# Patient Record
Sex: Female | Born: 1970 | Race: Black or African American | Hispanic: No | Marital: Single | Smoking: Never smoker
Health system: Southern US, Community
[De-identification: ages and names within clinical notes are randomized; demographics above are authoritative.]

## PROBLEM LIST (undated history)

## (undated) ENCOUNTER — Ambulatory Visit (HOSPITAL_COMMUNITY): Admission: EM | Source: Home / Self Care

## (undated) DIAGNOSIS — Z5189 Encounter for other specified aftercare: Secondary | ICD-10-CM

## (undated) DIAGNOSIS — K5909 Other constipation: Secondary | ICD-10-CM

## (undated) DIAGNOSIS — D649 Anemia, unspecified: Secondary | ICD-10-CM

## (undated) DIAGNOSIS — E0590 Thyrotoxicosis, unspecified without thyrotoxic crisis or storm: Secondary | ICD-10-CM

## (undated) DIAGNOSIS — T7840XA Allergy, unspecified, initial encounter: Secondary | ICD-10-CM

## (undated) HISTORY — DX: Allergy, unspecified, initial encounter: T78.40XA

## (undated) HISTORY — PX: CHOLECYSTECTOMY: SHX55

## (undated) HISTORY — DX: Encounter for other specified aftercare: Z51.89

---

## 2008-12-18 HISTORY — PX: CHOLECYSTECTOMY: SHX55

## 2013-11-01 ENCOUNTER — Inpatient Hospital Stay (HOSPITAL_COMMUNITY): Payer: 59

## 2013-11-01 ENCOUNTER — Encounter (HOSPITAL_COMMUNITY): Payer: Self-pay

## 2013-11-01 ENCOUNTER — Observation Stay (HOSPITAL_COMMUNITY)
Admission: AD | Admit: 2013-11-01 | Discharge: 2013-11-02 | Disposition: A | Discharge status: Home / Self Care | Payer: 59 | Source: Ambulatory Visit | Attending: Obstetrics and Gynecology | Admitting: Obstetrics and Gynecology

## 2013-11-01 DIAGNOSIS — R5381 Other malaise: Secondary | ICD-10-CM | POA: Insufficient documentation

## 2013-11-01 DIAGNOSIS — R42 Dizziness and giddiness: Secondary | ICD-10-CM | POA: Insufficient documentation

## 2013-11-01 DIAGNOSIS — R5383 Other fatigue: Secondary | ICD-10-CM

## 2013-11-01 DIAGNOSIS — D649 Anemia, unspecified: Secondary | ICD-10-CM

## 2013-11-01 DIAGNOSIS — D500 Iron deficiency anemia secondary to blood loss (chronic): Principal | ICD-10-CM | POA: Insufficient documentation

## 2013-11-01 DIAGNOSIS — N920 Excessive and frequent menstruation with regular cycle: Secondary | ICD-10-CM | POA: Insufficient documentation

## 2013-11-01 HISTORY — DX: Other constipation: K59.09

## 2013-11-01 HISTORY — DX: Thyrotoxicosis, unspecified without thyrotoxic crisis or storm: E05.90

## 2013-11-01 HISTORY — DX: Anemia, unspecified: D64.9

## 2013-11-01 LAB — CBC
Hemoglobin: 8.7 g/dL — ABNORMAL LOW (ref 12.0–15.0)
MCH: 31.2 pg (ref 26.0–34.0)
MCHC: 36 g/dL (ref 30.0–36.0)
Platelets: 248 10*3/uL (ref 150–400)
RDW: 11.7 % (ref 11.5–15.5)
WBC: 8.8 10*3/uL (ref 4.0–10.5)

## 2013-11-01 LAB — ABO/RH: ABO/RH(D): B NEG

## 2013-11-01 LAB — POCT PREGNANCY, URINE: Preg Test, Ur: NEGATIVE

## 2013-11-01 LAB — HEMOGLOBIN AND HEMATOCRIT, BLOOD
HCT: 21.5 % — ABNORMAL LOW (ref 36.0–46.0)
Hemoglobin: 7.8 g/dL — ABNORMAL LOW (ref 12.0–15.0)

## 2013-11-01 LAB — WET PREP, GENITAL: Yeast Wet Prep HPF POC: NONE SEEN

## 2013-11-01 MED ORDER — SODIUM CHLORIDE 0.9 % IJ SOLN: 3.0000 mL | Freq: Two times a day (BID) | INTRAMUSCULAR | Status: DC

## 2013-11-01 MED ORDER — PRENATAL MULTIVITAMIN CH
1.0000 | ORAL_TABLET | Freq: Every day | ORAL | Status: DC
  Filled 2013-11-01 (×2): qty 1

## 2013-11-01 MED ORDER — OXYCODONE-ACETAMINOPHEN 5-325 MG PO TABS: 1.0000 | ORAL_TABLET | ORAL | Status: DC | PRN

## 2013-11-01 MED ORDER — IBUPROFEN 600 MG PO TABS: 600.0000 mg | ORAL_TABLET | Freq: Four times a day (QID) | ORAL | Status: DC | PRN

## 2013-11-01 MED ORDER — TRANEXAMIC ACID 650 MG PO TABS
1300.0000 mg | ORAL_TABLET | Freq: Three times a day (TID) | ORAL | Status: DC
  Administered 2013-11-01 (×2): 1300 mg via ORAL
  Filled 2013-11-01 (×3): qty 2

## 2013-11-01 MED ORDER — SODIUM CHLORIDE 0.9 % IV SOLN: 250.0000 mL | INTRAVENOUS | Status: DC | PRN

## 2013-11-01 MED ORDER — SODIUM CHLORIDE 0.9 % IJ SOLN: 3.0000 mL | INTRAMUSCULAR | Status: DC | PRN

## 2013-11-01 MED ORDER — LACTATED RINGERS IV SOLN
INTRAVENOUS | Status: DC
  Administered 2013-11-02: 04:00:00 via INTRAVENOUS

## 2013-11-01 MED ORDER — ACETAMINOPHEN 325 MG PO TABS
650.0000 mg | ORAL_TABLET | Freq: Once | ORAL | Status: AC
  Administered 2013-11-01: 650 mg via ORAL
  Filled 2013-11-01: qty 2

## 2013-11-01 MED ORDER — ONDANSETRON 8 MG PO TBDP
8.0000 mg | ORAL_TABLET | Freq: Once | ORAL | Status: AC
  Administered 2013-11-01: 8 mg via ORAL
  Filled 2013-11-01: qty 1

## 2013-11-01 MED ORDER — DIPHENHYDRAMINE HCL 50 MG/ML IJ SOLN
25.0000 mg | Freq: Once | INTRAMUSCULAR | Status: AC
  Administered 2013-11-01: 25 mg via INTRAVENOUS
  Filled 2013-11-01: qty 1

## 2013-11-01 MED ORDER — LACTATED RINGERS IV BOLUS (SEPSIS)
1000.0000 mL | Freq: Once | INTRAVENOUS | Status: AC
  Administered 2013-11-01: 1000 mL via INTRAVENOUS

## 2013-11-01 NOTE — MAU Note (Signed)
Pt states bleeding began Thursday am, however bleeding became very heavy last pm, early this am. Notes medium sized clots. Mild pain intermittently.

## 2013-11-01 NOTE — MAU Provider Note (Signed)
History     CSN: [id number]  Arrival date and time: 11/01/13 0272   First Provider Initiated Contact with Patient 11/01/13 0848      Chief Complaint  Patient presents with  . Vaginal Bleeding   HPI  42 y.o. [id number] with heavy vaginal bleeding x 2 days. Soaking about 1 pad/hour and passing fist sized clots. Her OB/GYN from home called in Lysteda for her, she took 2 tabs yesterday night and 2 this morning. She is also taking Motrin 800 mg TID. States she had a Nuva Ring in when the bleeding started, removed once bleeding started. She has been continuously cycling on Nuva Ring x 8 months with no break. Has been having spotting over the last few weeks. She does not usually have heavy periods or irregular bleeding. + mild cramping. + dizziness.   Past Medical History  Diagnosis Date  . Anemia   . Hyperthyroidism   . Constipation, chronic     Past Surgical History  Procedure Laterality Date  . Cesarean section      Breech  . Cholecystectomy      History reviewed. No pertinent family history.  History  Substance Use Topics  . Smoking status: Never Smoker   . Smokeless tobacco: Never Used  . Alcohol Use: No    Allergies:  Allergies  Allergen Reactions  . Sulfa Antibiotics Anaphylaxis    Prescriptions prior to admission  Medication Sig Dispense Refill  . etonogestrel-ethinyl estradiol (NUVARING) 0.12-0.015 MG/24HR vaginal ring Place 1 each vaginally every 28 (twenty-eight) days. Insert vaginally and leave in place for 3 consecutive weeks, then remove for 1 week.      . methimazole (TAPAZOLE) 5 MG tablet Take 5 mg by mouth 3 (three) times daily. While nuvaring is in place      . tranexamic acid (LYSTEDA) 650 MG TABS tablet Take 1,300 mg by mouth 3 (three) times daily. Up to 5 days for heavy bleeding      . Vitamin D, Ergocalciferol, (DRISDOL) 50000 UNITS CAPS capsule Take 50,000 Units by mouth every 7 (seven) days. fridays      . [DISCONTINUED] ibuprofen (ADVIL,MOTRIN) 800  MG tablet Take 800 mg by mouth every 8 (eight) hours as needed for moderate pain.        Review of Systems  Respiratory: Negative.   Cardiovascular: Negative.   Gastrointestinal: Positive for abdominal pain (cramping). Negative for nausea, vomiting, diarrhea and constipation.  Genitourinary: Negative for dysuria, urgency, frequency, hematuria and flank pain.       + vaginal bleeding  Musculoskeletal: Negative.   Neurological: Positive for dizziness and weakness.  Psychiatric/Behavioral: Negative.    Physical Exam   Blood pressure 105/59, pulse 114, temperature 98.5 F (36.9 C), temperature source Oral, resp. rate 18, height 5\' 1"  (1.549 m), weight 127 lb (57.607 kg), last menstrual period 10/30/2013, SpO2 100.00%.  Patient Vitals for the past 24 hrs:  BP Temp Temp src Pulse Resp SpO2 Height Weight  11/01/13 1243 105/59 mmHg - - 114 - - - -  11/01/13 1229 82/44 mmHg - - 93 - - - -  11/01/13 1226 81/44 mmHg - - 88 18 - - -  11/01/13 1150 75/43 mmHg 98.5 F (36.9 C) Oral 96 18 - - -  11/01/13 0847 111/73 mmHg 98.3 F (36.8 C) Oral 111 22 100 % 5\' 1"  (1.549 m) 127 lb (57.607 kg)    Physical Exam  Nursing note and vitals reviewed. Constitutional: She is oriented to person, place, and time.  She appears well-developed and well-nourished. No distress.  Cardiovascular: Normal rate and regular rhythm.   Respiratory: Effort normal.  GI: Soft. There is no tenderness.  Genitourinary: There is no rash, tenderness, lesion or injury on the right labia. There is no rash, tenderness, lesion or injury on the left labia. Uterus is enlarged. Uterus is not deviated, not fixed and not tender. Cervix exhibits no motion tenderness, no discharge and no friability. Right adnexum displays no mass, no tenderness and no fullness. Left adnexum displays no mass, no tenderness and no fullness. There is bleeding (moderate-heavy) around the vagina.  Musculoskeletal: Normal range of motion.  Neurological: She is  alert and oriented to person, place, and time.  Skin: Skin is warm and dry.  Psychiatric: She has a normal mood and affect.    MAU Course  Procedures Results for orders placed during the hospital encounter of 11/01/13 (from the past 24 hour(s))  POCT PREGNANCY, URINE     Status: None   Collection Time    11/01/13  8:43 AM      Result Value Range   Preg Test, Ur NEGATIVE  NEGATIVE  CBC     Status: Abnormal   Collection Time    11/01/13  8:49 AM      Result Value Range   WBC 8.8  4.0 - 10.5 K/uL   RBC 2.79 (*) 3.87 - 5.11 MIL/uL   Hemoglobin 8.7 (*) 12.0 - 15.0 g/dL   HCT 69.6 (*) 29.5 - 28.4 %   MCV 86.7  78.0 - 100.0 fL   MCH 31.2  26.0 - 34.0 pg   MCHC 36.0  30.0 - 36.0 g/dL   RDW 13.2  44.0 - 10.2 %   Platelets 248  150 - 400 K/uL  HEMOGLOBIN AND HEMATOCRIT, BLOOD     Status: Abnormal   Collection Time    11/01/13  8:49 AM      Result Value Range   Hemoglobin 7.8 (*) 12.0 - 15.0 g/dL   HCT 72.5 (*) 36.6 - 44.0 %  WET PREP, GENITAL     Status: Abnormal   Collection Time    11/01/13  9:11 AM      Result Value Range   Yeast Wet Prep HPF POC NONE SEEN  NONE SEEN   Trich, Wet Prep NONE SEEN  NONE SEEN   Clue Cells Wet Prep HPF POC NONE SEEN  NONE SEEN   WBC, Wet Prep HPF POC RARE (*) NONE SEEN   US Transvaginal Non-ob  11/01/2013   CLINICAL DATA:  Heavy vaginal bleeding, history of fibroids, urine pregnancy test negative  EXAM: TRANSABDOMINAL AND TRANSVAGINAL ULTRASOUND OF PELVIS  TECHNIQUE: Both transabdominal and transvaginal ultrasound examinations of the pelvis were performed. Transabdominal technique was performed for global imaging of the pelvis including uterus, ovaries, adnexal regions, and pelvic cul-de-sac. It was necessary to proceed with endovaginal exam following the transabdominal exam to visualize the ovaries and better detail.  COMPARISON:  None  FINDINGS: Uterus  Measurements: 10.2 x 5.0 x 6.1 cm. Mildly heterogeneous echotexture but no large fibroid by  ultrasound.  Endometrium  Thickness: 3 mm. Heterogeneous fluid within the lower uterine segment and cervix, suspect blood.  Right ovary  Measurements: 2.5 x 1.0 x 1.5 cm. Normal appearance/no adnexal mass.  Left ovary  Measurements: 1.8 x 1.2 x 1.3 cm. Normal appearance. No adnexal mass.  Other findings  No free fluid.  IMPRESSION: Heterogeneous fluid within the lower uterine segment and endocervical canal, suspect blood.  Mildly heterogeneous uterus but no large fibroid by ultrasound  Normal ovaries  No free fluid   Electronically Signed   By: Ruel Favors M.D.   On: 11/01/2013 11:41   US Pelvis Complete  11/01/2013   CLINICAL DATA:  Heavy vaginal bleeding, history of fibroids, urine pregnancy test negative  EXAM: TRANSABDOMINAL AND TRANSVAGINAL ULTRASOUND OF PELVIS  TECHNIQUE: Both transabdominal and transvaginal ultrasound examinations of the pelvis were performed. Transabdominal technique was performed for global imaging of the pelvis including uterus, ovaries, adnexal regions, and pelvic cul-de-sac. It was necessary to proceed with endovaginal exam following the transabdominal exam to visualize the ovaries and better detail.  COMPARISON:  None  FINDINGS: Uterus  Measurements: 10.2 x 5.0 x 6.1 cm. Mildly heterogeneous echotexture but no large fibroid by ultrasound.  Endometrium  Thickness: 3 mm. Heterogeneous fluid within the lower uterine segment and cervix, suspect blood.  Right ovary  Measurements: 2.5 x 1.0 x 1.5 cm. Normal appearance/no adnexal mass.  Left ovary  Measurements: 1.8 x 1.2 x 1.3 cm. Normal appearance. No adnexal mass.  Other findings  No free fluid.  IMPRESSION: Heterogeneous fluid within the lower uterine segment and endocervical canal, suspect blood.  Mildly heterogeneous uterus but no large fibroid by ultrasound  Normal ovaries  No free fluid   Electronically Signed   By: Ruel Favors M.D.   On: 11/01/2013 11:41   After return from u/s, bleeding continues to be heavy, pt remains  lightheaded, hypotensive now. IV fluid bolus initiated and repeat H&H ordered. Hct decreased from 24.2 to 21.5 in less than 4 hours, Dr. Jolayne Panther consulted.   Assessment and Plan  DUB and Anemia Admit for blood transfusion, orders per Dr. Jeneen Montgomery 11/01/2013, 1:16 PM

## 2013-11-01 NOTE — MAU Note (Signed)
Pt very pale upon arrival. Helped to restroom and back to room from bathroom via wheelchair. Urinalysis cup full of dark red blood.

## 2013-11-02 DIAGNOSIS — N920 Excessive and frequent menstruation with regular cycle: Secondary | ICD-10-CM

## 2013-11-02 DIAGNOSIS — D649 Anemia, unspecified: Secondary | ICD-10-CM

## 2013-11-02 LAB — TYPE AND SCREEN
ABO/RH(D): B NEG
Unit division: 0
Weak D: POSITIVE

## 2013-11-02 LAB — CBC
HCT: 25.5 % — ABNORMAL LOW (ref 36.0–46.0)
Hemoglobin: 9.1 g/dL — ABNORMAL LOW (ref 12.0–15.0)
MCH: 30.5 pg (ref 26.0–34.0)
Platelets: 123 10*3/uL — ABNORMAL LOW (ref 150–400)
RBC: 2.98 MIL/uL — ABNORMAL LOW (ref 3.87–5.11)
WBC: 9.8 10*3/uL (ref 4.0–10.5)

## 2013-11-02 MED ORDER — FERROUS SULFATE 325 (65 FE) MG PO TABS: 325.0000 mg | ORAL_TABLET | Freq: Two times a day (BID) | ORAL | Status: DC

## 2013-11-02 MED ORDER — DOCUSATE SODIUM 100 MG PO CAPS: 100.0000 mg | ORAL_CAPSULE | Freq: Two times a day (BID) | ORAL | Status: DC | PRN

## 2013-11-02 NOTE — Plan of Care (Signed)
Problem: Phase I Progression Outcomes Goal: Hemodynamically stable Outcome: Not Progressing Blood pressures remain low with a pulse that is WNL  Problem: Phase II Progression Outcomes Goal: Discharge plan established Outcome: Not Progressing VSS Bleeding WNL Hgb stable Patient able to ambulate and not get dizzy  Problem: Phase III Progression Outcomes Goal: Pain controlled on oral analgesia Outcome: Not Applicable Date Met:  11/02/13 Patein has denied any pain Goal: Activity at appropriate level-compared to baseline (UP IN CHAIR FOR HEMODIALYSIS)  Outcome: Completed/Met Date Met:  11/02/13 Less dizzy when standing

## 2013-11-02 NOTE — Discharge Summary (Signed)
Physician Discharge Summary  Patient ID: Debra Armstrong MRN: [id number] DOB/AGE: [birth year] 42 y.o.  Admit date: 11/01/2013 Discharge date: 11/02/2013  Admission Diagnoses: menorrhagia with symptomatic anemia  Discharge Diagnoses: same s/p blood transfusion Active Problems:   * No active hospital problems. *   Discharged Condition: good  Hospital Course: 42 yo G3P1021 admitted secondary to symptomatic anemia secondary to menorrhagia. Patient presented to MAU secondary to generalized weakness and dizziness. Patient had been using Nuvaring continuously for 8 months and experienced heavy vaginal bleeding this past week. She was started on lysteda but only took 2 doses before the symptoms of anemia began. Patient was continued on lysteda during her stay and was transfused 3 units. Her symptoms resolved and patient was discharged home. Discharge precautions were provided  Consults: None  Significant Diagnostic Studies: labs: hg 8.7-->7.8--> 9.1 and radiology: Ultrasound: normal with 5 mm endometrial lining  Treatments: 3 units pRBC  Discharge Exam: Blood pressure 88/50, pulse 79, temperature 98.3 F (36.8 C), temperature source Oral, resp. rate 16, height 5\' 1"  (1.549 m), weight 127 lb (57.607 kg), last menstrual period 10/30/2013, SpO2 100.00%. General appearance: alert, cooperative and no distress Resp: clear to auscultation bilaterally Cardio: regular rate and rhythm GI: soft, non-tender; bowel sounds normal; no masses,  no organomegaly Extremities: extremities normal, atraumatic, no cyanosis or edema and no edema, redness or tenderness in the calves or thighs  Disposition: Final discharge disposition not confirmed     Medication List    STOP taking these medications       ibuprofen 800 MG tablet  Commonly known as:  ADVIL,MOTRIN      TAKE these medications       docusate sodium 100 MG capsule  Commonly known as:  COLACE  Take 1 capsule (100 mg total) by mouth 2 (two)  times daily as needed.     etonogestrel-ethinyl estradiol 0.12-0.015 MG/24HR vaginal ring  Commonly known as:  NUVARING  Place 1 each vaginally every 28 (twenty-eight) days. Insert vaginally and leave in place for 3 consecutive weeks, then remove for 1 week.     ferrous sulfate 325 (65 FE) MG tablet  Commonly known as:  FERROUSUL  Take 1 tablet (325 mg total) by mouth 2 (two) times daily.     methimazole 5 MG tablet  Commonly known as:  TAPAZOLE  Take 5 mg by mouth 3 (three) times daily. While nuvaring is in place     tranexamic acid 650 MG Tabs tablet  Commonly known as:  LYSTEDA  Take 1,300 mg by mouth 3 (three) times daily. Up to 5 days for heavy bleeding     Vitamin D (Ergocalciferol) 50000 UNITS Caps capsule  Commonly known as:  DRISDOL  Take 50,000 Units by mouth every 7 (seven) days. fridays         Signed: Merl Bommarito 11/02/2013, 6:43 AM

## 2013-11-02 NOTE — Progress Notes (Signed)
Discharge instructions provided to patient at bedside.  Activity, medications, when to call the doctor and community resources discussed.  No questions at this time.  Home medications returned from pharmacy, 22 tablets of tranexamic acid.  Patient left unit in stable condition with all personal belongings accompanied by staff.  Osvaldo Angst, RN----------

## 2013-11-03 LAB — GC/CHLAMYDIA PROBE AMP: CT Probe RNA: NEGATIVE

## 2013-11-04 NOTE — MAU Provider Note (Signed)
Attestation of Attending Supervision of Advanced Practitioner (CNM/NP): Evaluation and management procedures were performed by the Advanced Practitioner under my supervision and collaboration.  I have reviewed the Advanced Practitioner's note and chart, and I agree with the management and plan.  Marche Hottenstein 11/04/2013 9:31 AM

## 2014-06-15 ENCOUNTER — Ambulatory Visit (HOSPITAL_COMMUNITY)
Admission: AD | Admit: 2014-06-15 | Discharge: 2014-06-15 | Disposition: A | Discharge status: Home / Self Care | Payer: 59 | Source: Ambulatory Visit | Attending: Obstetrics and Gynecology | Admitting: Obstetrics and Gynecology

## 2014-06-15 ENCOUNTER — Other Ambulatory Visit: Payer: Self-pay | Admitting: Obstetrics and Gynecology

## 2014-06-15 DIAGNOSIS — R079 Chest pain, unspecified: Secondary | ICD-10-CM | POA: Insufficient documentation

## 2014-08-13 IMAGING — US US TRANSVAGINAL NON-OB
1 series · 14 of 25 positions shown · non-contrast
Comparison: None

CLINICAL DATA: Heavy vaginal bleeding, history of fibroids, urine
pregnancy test negative

EXAM:
TRANSABDOMINAL AND TRANSVAGINAL ULTRASOUND OF PELVIS
TECHNIQUE: Both transabdominal and transvaginal ultrasound examinations of the
pelvis were performed. Transabdominal technique was performed for
global imaging of the pelvis including uterus, ovaries, adnexal
regions, and pelvic cul-de-sac. It was necessary to proceed with
endovaginal exam following the transabdominal exam to visualize the
ovaries and better detail.

[Series 1: us pelvis complete · 14 of 44 slices shown]
[im 1/44]
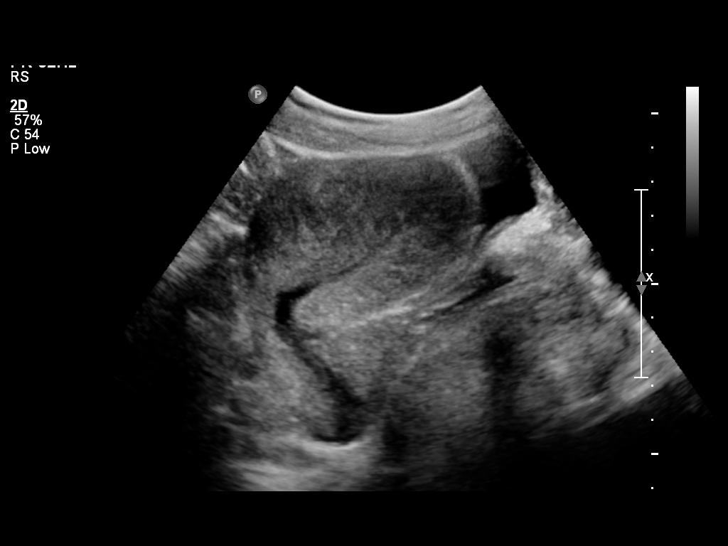
[im 4/44]
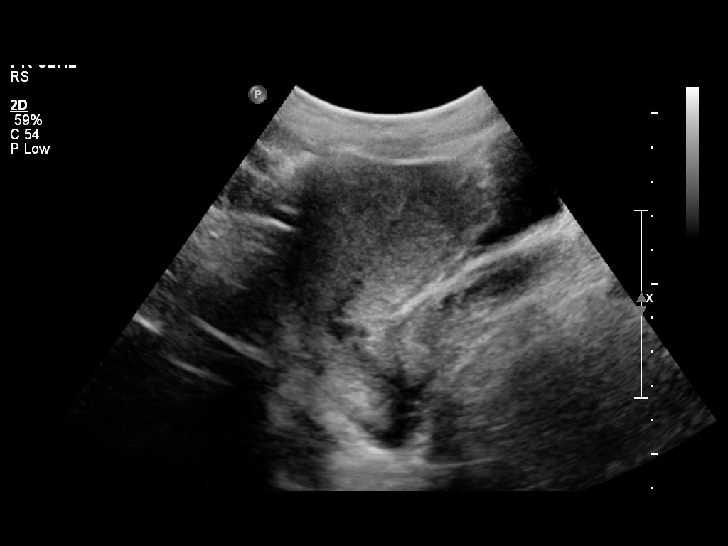
[im 8/44]
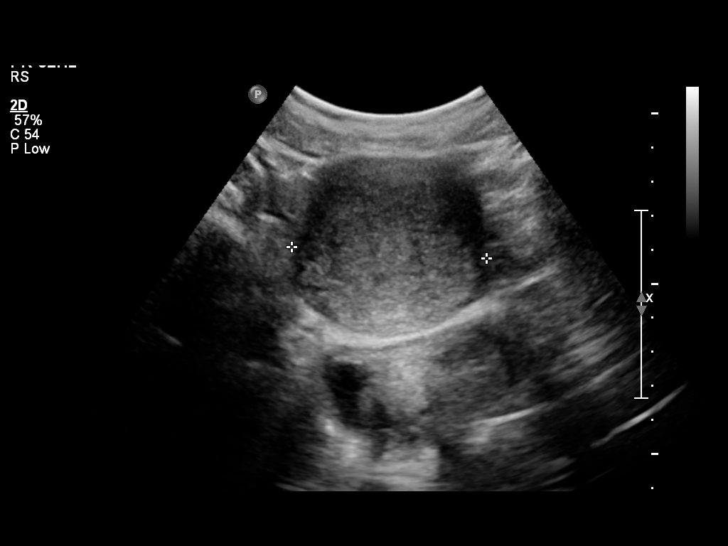
[im 11/44]
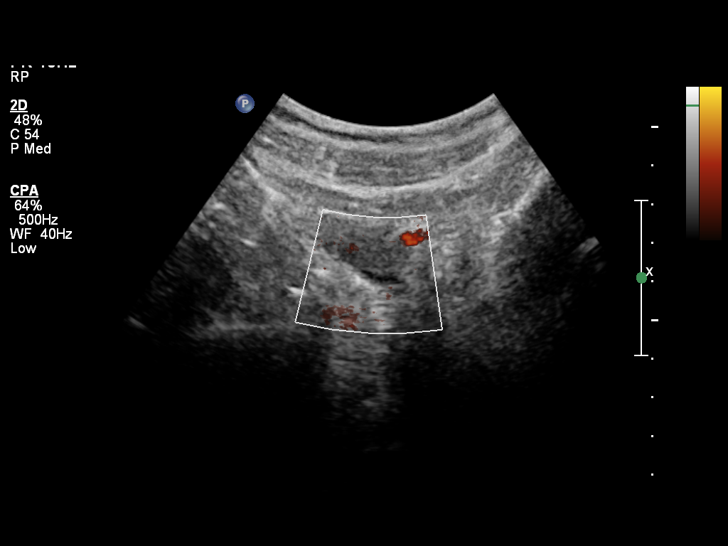
[im 15/44]
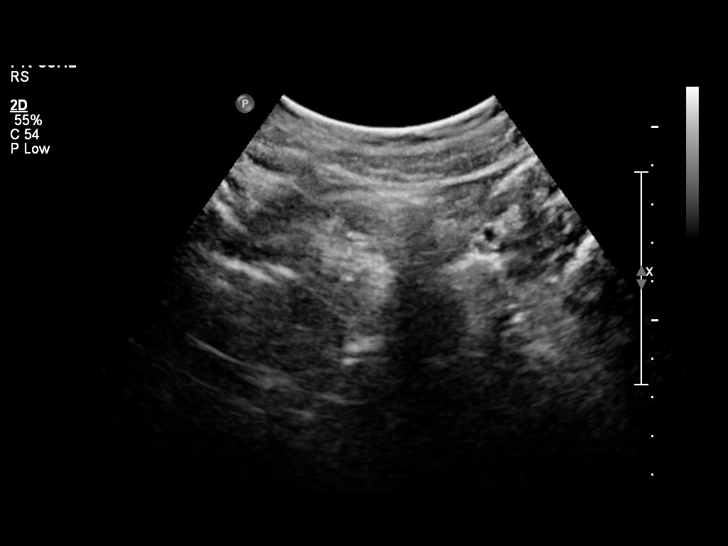
[im 17/44]
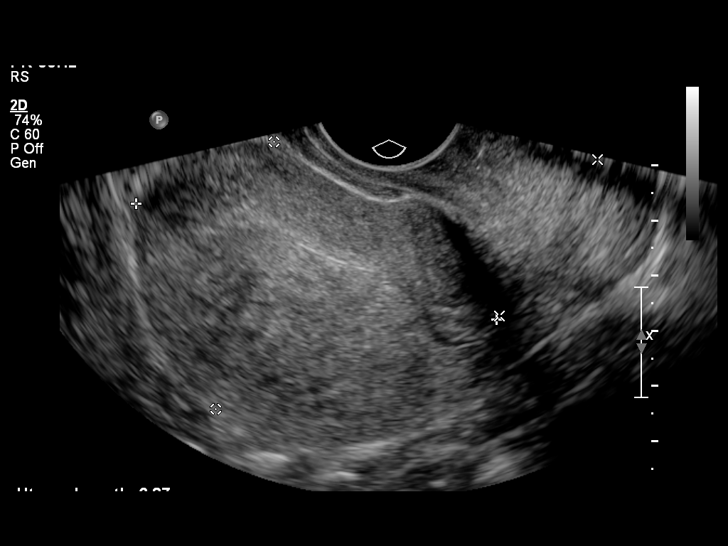
[im 20/44]
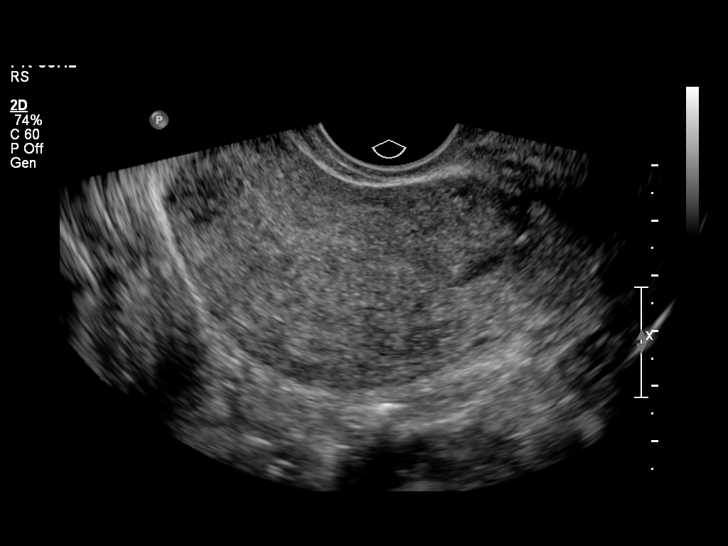
[im 24/44]
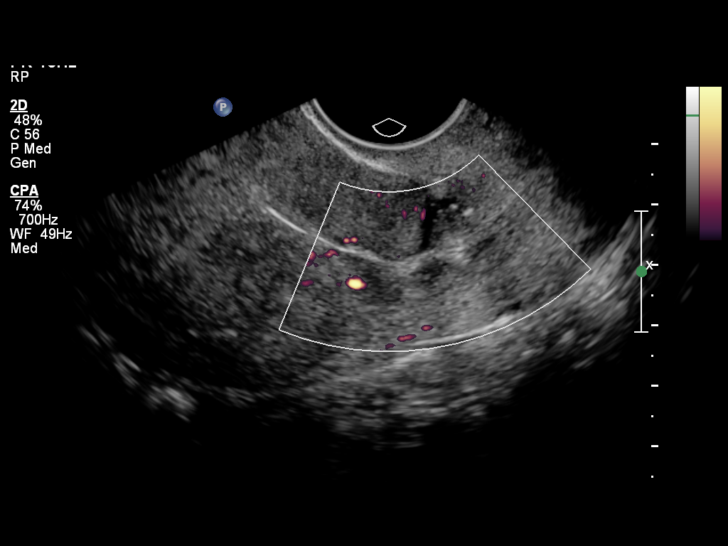
[im 27/44]
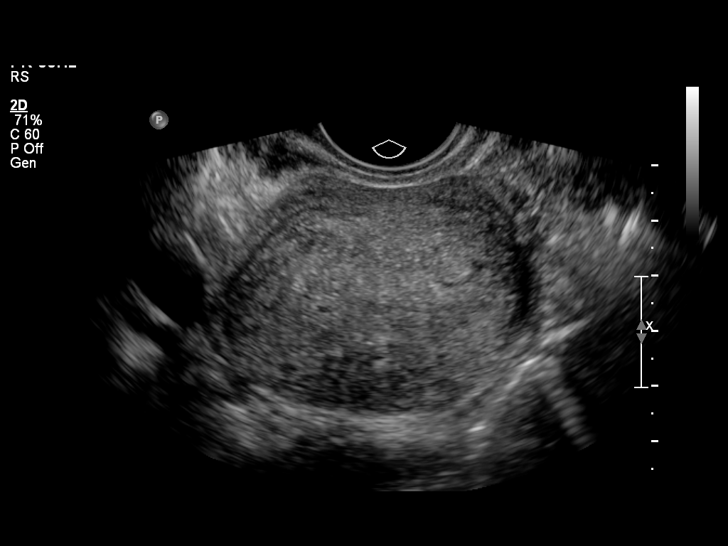
[im 29/44]
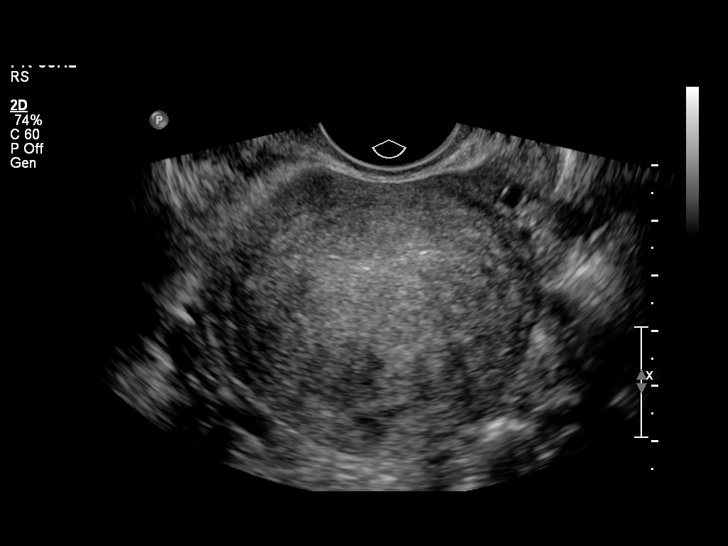
[im 33/44]
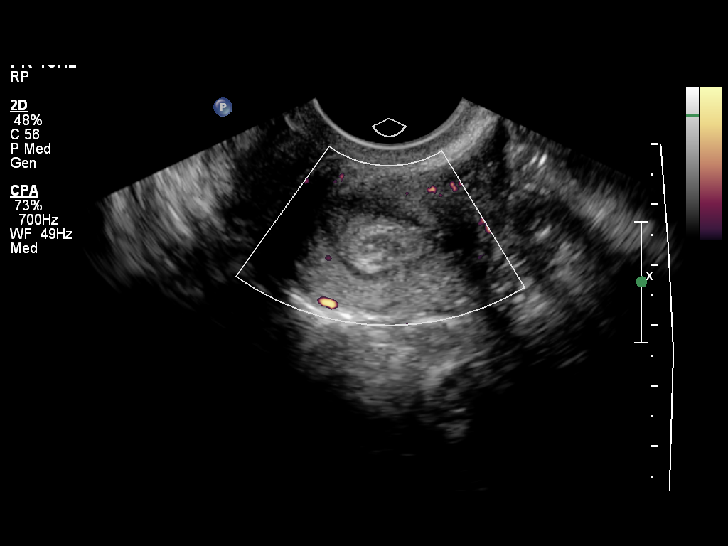
[im 36/44]
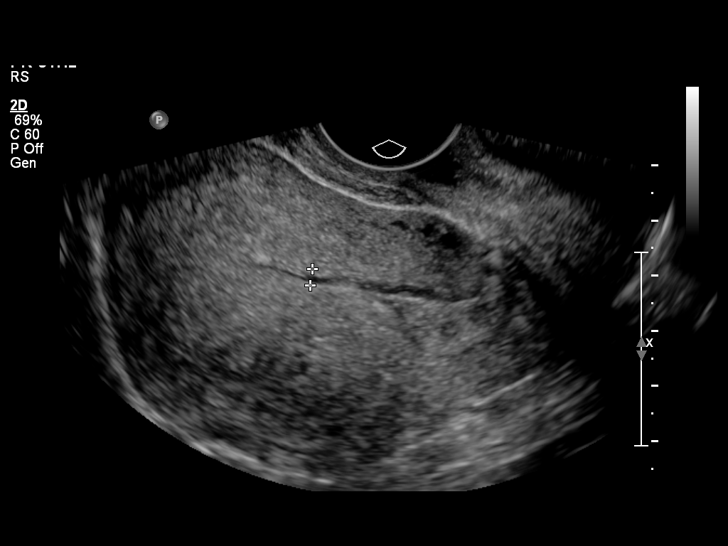
[im 40/44]
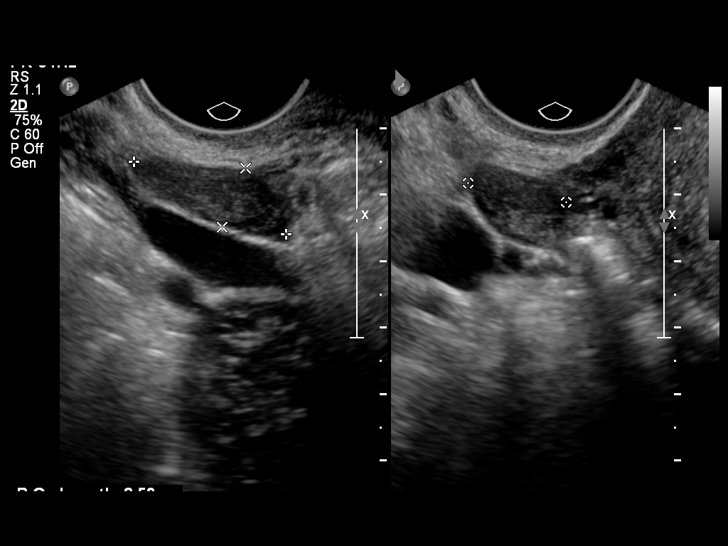
[im 44/44]
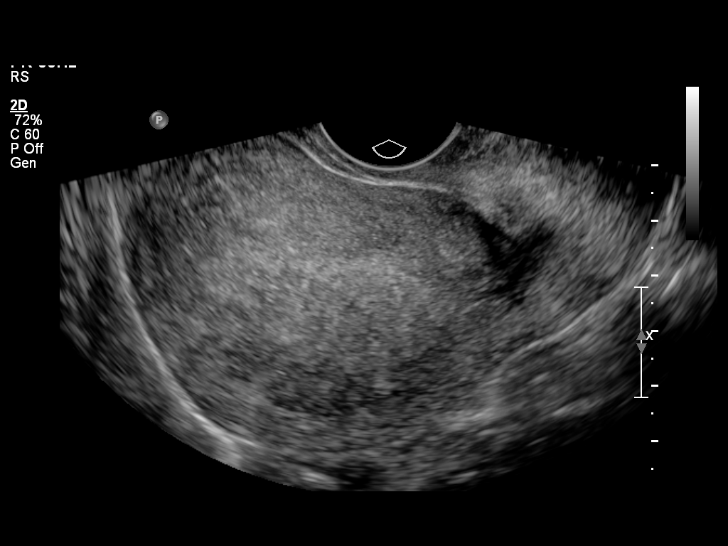

[14 of 25 positions shown; findings below may reference images not displayed]

FINDINGS: Uterus

Measurements: 10.2 x 5.0 x 6.1 cm. Mildly heterogeneous echotexture
but no large fibroid by ultrasound.

Endometrium

Thickness: 3 mm. Heterogeneous fluid within the lower uterine
segment and cervix, suspect blood.

Right ovary

Measurements: 2.5 x 1.0 x 1.5 cm. Normal appearance/no adnexal mass.

Left ovary

Measurements: 1.8 x 1.2 x 1.3 cm. Normal appearance. No adnexal
mass.

Other findings

No free fluid.
IMPRESSION: Heterogeneous fluid within the lower uterine segment and
endocervical canal, suspect blood.

Mildly heterogeneous uterus but no large fibroid by ultrasound

Normal ovaries

No free fluid

## 2014-10-19 ENCOUNTER — Encounter (HOSPITAL_COMMUNITY): Payer: Self-pay

## 2016-01-31 MED FILL — VIT D2 1.25 MG (50,000 UNIT: 1.25 MG | 28 days supply | Qty: 4 | Fill #1

## 2016-01-31 MED FILL — IBUPROFEN 800 MG TABLET: 800 | 10 days supply | Qty: 30 | Fill #2

## 2016-02-09 MED FILL — metroNIDAZOLE 500 MG TABS: 500 | 14 days supply | Qty: 14 | Fill #0

## 2016-02-25 MED FILL — NUVARING VAGINAL RING: 0.12-0.015 | 84 days supply | Qty: 3 | Fill #1

## 2016-06-05 MED FILL — NUVARING VAGINAL RING: 0.12-0.015 | 84 days supply | Qty: 3 | Fill #2

## 2016-08-28 MED FILL — IBUPROFEN 800 MG TABLET: 800 | 10 days supply | Qty: 30 | Fill #0

## 2016-08-28 MED FILL — NUVARING VAGINAL RING: 0.12-0.015 | 84 days supply | Qty: 3 | Fill #3

## 2016-12-05 MED FILL — NUVARING VAGINAL RING: 0.12-0.015 | 28 days supply | Qty: 1 | Fill #0

## 2016-12-15 DIAGNOSIS — H52223 Regular astigmatism, bilateral: Secondary | ICD-10-CM | POA: Diagnosis not present

## 2017-01-25 MED FILL — IBUPROFEN 800 MG TABLET: 800 | 10 days supply | Qty: 30 | Fill #1

## 2017-02-01 DIAGNOSIS — Z0000 Encounter for general adult medical examination without abnormal findings: Secondary | ICD-10-CM | POA: Diagnosis not present

## 2017-02-01 DIAGNOSIS — Z131 Encounter for screening for diabetes mellitus: Secondary | ICD-10-CM | POA: Diagnosis not present

## 2017-02-01 DIAGNOSIS — Z6826 Body mass index (BMI) 26.0-26.9, adult: Secondary | ICD-10-CM | POA: Diagnosis not present

## 2017-02-01 DIAGNOSIS — Z1329 Encounter for screening for other suspected endocrine disorder: Secondary | ICD-10-CM | POA: Diagnosis not present

## 2017-02-01 DIAGNOSIS — Z01419 Encounter for gynecological examination (general) (routine) without abnormal findings: Secondary | ICD-10-CM | POA: Diagnosis not present

## 2017-02-01 DIAGNOSIS — Z13220 Encounter for screening for lipoid disorders: Secondary | ICD-10-CM | POA: Diagnosis not present

## 2017-02-01 DIAGNOSIS — Z130 Encounter for screening for diseases of the blood and blood-forming organs and certain disorders involving the immune mechanism: Secondary | ICD-10-CM | POA: Diagnosis not present

## 2017-02-01 DIAGNOSIS — Z1151 Encounter for screening for human papillomavirus (HPV): Secondary | ICD-10-CM | POA: Diagnosis not present

## 2017-02-01 DIAGNOSIS — Z1231 Encounter for screening mammogram for malignant neoplasm of breast: Secondary | ICD-10-CM | POA: Diagnosis not present

## 2017-02-01 MED FILL — NUVARING VAGINAL RING: 0.12-0.015 | 84 days supply | Qty: 3 | Fill #0

## 2017-02-07 MED FILL — VIT D2 1.25 MG (50,000 UNIT: 1.25 MG | 56 days supply | Qty: 8 | Fill #0

## 2017-05-14 ENCOUNTER — Ambulatory Visit (HOSPITAL_COMMUNITY)
Admission: EM | Admit: 2017-05-14 | Discharge: 2017-05-14 | Disposition: A | Discharge status: Home / Self Care | Payer: 59 | Attending: Family Medicine | Admitting: Family Medicine

## 2017-05-14 ENCOUNTER — Encounter (HOSPITAL_COMMUNITY): Payer: Self-pay | Admitting: Emergency Medicine

## 2017-05-14 DIAGNOSIS — N898 Other specified noninflammatory disorders of vagina: Secondary | ICD-10-CM

## 2017-05-14 LAB — POCT URINALYSIS DIP (DEVICE)
Bilirubin Urine: NEGATIVE
GLUCOSE, UA: NEGATIVE mg/dL
KETONES UR: NEGATIVE mg/dL
Leukocytes, UA: NEGATIVE
Nitrite: NEGATIVE
Protein, ur: NEGATIVE mg/dL
SPECIFIC GRAVITY, URINE: 1.02 (ref 1.005–1.030)
Urobilinogen, UA: 1 mg/dL (ref 0.0–1.0)
pH: 6 (ref 5.0–8.0)

## 2017-05-14 LAB — POCT PREGNANCY, URINE: PREG TEST UR: NEGATIVE

## 2017-05-14 MED ORDER — FLUCONAZOLE 200 MG PO TABS: ORAL_TABLET | ORAL | 0 refills | Status: DC

## 2017-05-14 MED ORDER — METRONIDAZOLE 500 MG PO TABS: 500.0000 mg | ORAL_TABLET | Freq: Two times a day (BID) | ORAL | 0 refills | Status: DC

## 2017-05-14 NOTE — ED Provider Notes (Signed)
CSN: [id number]     Arrival date & time 05/14/17  1257 History   First MD Initiated Contact with Patient 05/14/17 1348     Chief Complaint  Patient presents with  . Vaginal Itching   (Consider location/radiation/quality/duration/timing/severity/associated sxs/prior Treatment) 46 year old female presents with a chief complaint of vaginal itching ongoing for 2-3 days. Denies pain with intercourse, denies dysuria, denies pelvic pain, abdominal pain, or flank pain.   The history is provided by the patient.  Vaginal Itching  This is a new problem. The current episode started 2 days ago. The problem occurs constantly. The problem has not changed since onset.Exacerbated by: Monistat. Relieved by: vinegar douche  She has tried nothing for the symptoms. The treatment provided no relief.    Past Medical History:  Diagnosis Date  . Anemia   . Constipation, chronic   . Hyperthyroidism    Past Surgical History:  Procedure Laterality Date  . CESAREAN SECTION     Breech  . CHOLECYSTECTOMY     History reviewed. No pertinent family history. Social History  Substance Use Topics  . Smoking status: Never Smoker  . Smokeless tobacco: Never Used  . Alcohol use No   OB History    Gravida Para Term Preterm AB Living   3 1 1   2 1    SAB TAB Ectopic Multiple Live Births   2             Review of Systems  Constitutional: Negative.   HENT: Negative.   Respiratory: Negative.   Cardiovascular: Negative.   Gastrointestinal: Negative.   Genitourinary: Negative for difficulty urinating, dyspareunia, dysuria, flank pain, frequency, pelvic pain, vaginal bleeding, vaginal discharge and vaginal pain.       Vaginal itching  Musculoskeletal: Negative.   Skin: Negative.   Neurological: Negative.     Allergies  Sulfa antibiotics  Home Medications   Prior to Admission medications   Medication Sig Start Date End Date Taking? Authorizing Provider  fluconazole (DIFLUCAN) 200 MG tablet Take one  tablet today, wait 3 days, take the second tablet 05/14/17   Dorena Bodo, NP  metroNIDAZOLE (FLAGYL) 500 MG tablet Take 1 tablet (500 mg total) by mouth 2 (two) times daily. 05/14/17   Dorena Bodo, NP   Meds Ordered and Administered this Visit  Medications - No data to display  BP (!) 153/84 (BP Location: Right Arm)   Pulse 76   Temp 98.5 F (36.9 C) (Oral)   Resp 16   SpO2 98%  No data found.   Physical Exam  Constitutional: She is oriented to person, place, and time. She appears well-developed and well-nourished. No distress.  HENT:  Head: Normocephalic and atraumatic.  Right Ear: External ear normal.  Left Ear: External ear normal.  Eyes: Conjunctivae are normal.  Neck: Normal range of motion.  Cardiovascular: Normal rate and regular rhythm.   Pulmonary/Chest: Effort normal and breath sounds normal.  Abdominal: Soft. Bowel sounds are normal. There is no tenderness.  Genitourinary:  Genitourinary Comments: Deferred at patient request  Neurological: She is alert and oriented to person, place, and time.  Skin: Skin is warm and dry. Capillary refill takes less than 2 seconds. No rash noted. She is not diaphoretic. No erythema.  Psychiatric: She has a normal mood and affect. Her behavior is normal.  Nursing note and vitals reviewed.   Urgent Care Course     Procedures (including critical care time)  Labs Review Labs Reviewed  POCT URINALYSIS DIP (DEVICE) -  Abnormal; Notable for the following:       Result Value   Hgb urine dipstick TRACE (*)    All other components within normal limits  URINE CULTURE  POCT PREGNANCY, URINE  URINE CYTOLOGY ANCILLARY ONLY    Imaging Review No results found.      MDM   1. Vaginal irritation    Given Diflucan, metronidazole, we'll notify of pertinent lab work if positive in 3-5 business days.    Dorena BodoKennard, Jeselle Hiser, NP 05/14/17 1440

## 2017-05-14 NOTE — ED Triage Notes (Signed)
[facility] a [facility]. The patient also reported a new sexual partner and requested STD testing.

## 2017-05-14 NOTE — Discharge Instructions (Signed)
Your urine is being tested for multiple different types of infectious conditions. I have started you today on Diflucan, and metronidazole. Take these medicines as directed and avoid any alcohol for the next 7 days as it will make you very sick. If there is anything positive on your tests, you will be notified in 3-5 business days, if negative, it will most likely take longer to make the notification. Follow up with your gynecologist as needed.

## 2017-06-04 MED FILL — NUVARING VAGINAL RING: 0.12-0.015 | 84 days supply | Qty: 3 | Fill #1

## 2017-09-14 MED FILL — NUVARING VAGINAL RING: 0.12-0.015 | 84 days supply | Qty: 3 | Fill #2

## 2017-12-03 MED FILL — NUVARING VAGINAL RING: 0.12-0.015 | 84 days supply | Qty: 3 | Fill #3

## 2017-12-18 HISTORY — PX: EYE SURGERY: SHX253

## 2018-01-16 MED FILL — FLUCONAZOLE 150 MG TABLET: 150 | 1 days supply | Qty: 1 | Fill #0

## 2018-01-18 MED FILL — IBUPROFEN 800 MG TAB: 800 | 10 days supply | Qty: 30 | Fill #0

## 2018-02-13 DIAGNOSIS — Z1231 Encounter for screening mammogram for malignant neoplasm of breast: Secondary | ICD-10-CM | POA: Diagnosis not present

## 2018-02-13 DIAGNOSIS — Z1151 Encounter for screening for human papillomavirus (HPV): Secondary | ICD-10-CM | POA: Diagnosis not present

## 2018-02-13 DIAGNOSIS — Z01419 Encounter for gynecological examination (general) (routine) without abnormal findings: Secondary | ICD-10-CM | POA: Diagnosis not present

## 2018-02-13 DIAGNOSIS — Z6828 Body mass index (BMI) 28.0-28.9, adult: Secondary | ICD-10-CM | POA: Diagnosis not present

## 2018-02-13 LAB — HM MAMMOGRAPHY

## 2018-02-13 LAB — HM PAP SMEAR

## 2018-02-14 ENCOUNTER — Other Ambulatory Visit: Payer: Self-pay | Admitting: *Deleted

## 2018-02-14 ENCOUNTER — Other Ambulatory Visit (HOSPITAL_COMMUNITY)
Admission: AD | Admit: 2018-02-14 | Discharge: 2018-02-14 | Disposition: A | Discharge status: Home / Self Care | Payer: 59 | Source: Ambulatory Visit | Attending: Obstetrics and Gynecology | Admitting: Obstetrics and Gynecology

## 2018-02-14 DIAGNOSIS — Z0000 Encounter for general adult medical examination without abnormal findings: Secondary | ICD-10-CM | POA: Insufficient documentation

## 2018-02-14 DIAGNOSIS — E049 Nontoxic goiter, unspecified: Secondary | ICD-10-CM | POA: Diagnosis not present

## 2018-02-14 DIAGNOSIS — Z13220 Encounter for screening for lipoid disorders: Secondary | ICD-10-CM | POA: Insufficient documentation

## 2018-02-14 DIAGNOSIS — Z139 Encounter for screening, unspecified: Secondary | ICD-10-CM | POA: Insufficient documentation

## 2018-02-14 LAB — COMPREHENSIVE METABOLIC PANEL
ALT: 12 U/L — AB (ref 14–54)
AST: 17 U/L (ref 15–41)
Albumin: 3.9 g/dL (ref 3.5–5.0)
Alkaline Phosphatase: 83 U/L (ref 38–126)
Anion gap: 10 (ref 5–15)
BUN: 12 mg/dL (ref 6–20)
CO2: 22 mmol/L (ref 22–32)
Calcium: 9.2 mg/dL (ref 8.9–10.3)
Chloride: 104 mmol/L (ref 101–111)
Creatinine, Ser: 0.93 mg/dL (ref 0.44–1.00)
GFR calc Af Amer: >60 mL/min (ref >60)
Glucose, Bld: 92 mg/dL (ref 65–99)
Potassium: 3.7 mmol/L (ref 3.5–5.1)
Sodium: 136 mmol/L (ref 135–145)
Total Bilirubin: 0.8 mg/dL (ref 0.3–1.2)
Total Protein: 7.2 g/dL (ref 6.5–8.1)

## 2018-02-14 LAB — CBC WITH DIFFERENTIAL/PLATELET
BASOS ABS: 0 10*3/uL (ref 0.0–0.1)
Basophils Relative: 1 %
Eosinophils Absolute: 0.1 10*3/uL (ref 0.0–0.7)
Eosinophils Relative: 2 %
HCT: 41.2 % (ref 36.0–46.0)
Hemoglobin: 14.2 g/dL (ref 12.0–15.0)
LYMPHS PCT: 53 %
Lymphs Abs: 2.8 10*3/uL (ref 0.7–4.0)
MCH: 31.3 pg (ref 26.0–34.0)
MCHC: 34.5 g/dL (ref 30.0–36.0)
MCV: 90.7 fL (ref 78.0–100.0)
Monocytes Absolute: 0.2 10*3/uL (ref 0.1–1.0)
Monocytes Relative: 4 %
Neutro Abs: 2.1 10*3/uL (ref 1.7–7.7)
Neutrophils Relative %: 40 %
Platelets: 325 10*3/uL (ref 150–400)
RBC: 4.54 MIL/uL (ref 3.87–5.11)
RDW: 12.1 % (ref 11.5–15.5)
WBC: 5.2 10*3/uL (ref 4.0–10.5)

## 2018-02-14 LAB — LIPID PANEL
Cholesterol: 218 mg/dL — ABNORMAL HIGH (ref 0–200)
HDL: 102 mg/dL (ref >40)
LDL Cholesterol: 103 mg/dL — ABNORMAL HIGH (ref 0–99)
Total CHOL/HDL Ratio: 2.1 RATIO
Triglycerides: 67 mg/dL (ref <150)
VLDL: 13 mg/dL (ref 0–40)

## 2018-02-14 LAB — TSH: TSH: 1.598 u[IU]/mL (ref 0.350–4.500)

## 2018-02-14 LAB — T4, FREE: FREE T4: 0.72 ng/dL (ref 0.61–1.12)

## 2018-02-15 LAB — T3: T3 TOTAL: 99 ng/dL (ref 71–180)

## 2018-02-18 MED FILL — NUVARING VAGINAL RING: 0.12-0.015 | 84 days supply | Qty: 3 | Fill #0

## 2018-05-06 MED FILL — NUVARING VAGINAL RING: 0.12-0.015 | 84 days supply | Qty: 3 | Fill #1

## 2018-05-15 ENCOUNTER — Ambulatory Visit (INDEPENDENT_AMBULATORY_CARE_PROVIDER_SITE_OTHER): Payer: 59 | Admitting: Family Medicine

## 2018-05-15 ENCOUNTER — Encounter: Payer: Self-pay | Admitting: Family Medicine

## 2018-05-15 ENCOUNTER — Other Ambulatory Visit: Payer: Self-pay

## 2018-05-15 VITALS — BP 102/62 | HR 88 | Temp 98.2°F | Resp 14 | Ht 61.0 in | Wt 149.0 lb

## 2018-05-15 DIAGNOSIS — Z114 Encounter for screening for human immunodeficiency virus [HIV]: Secondary | ICD-10-CM

## 2018-05-15 DIAGNOSIS — Z0000 Encounter for general adult medical examination without abnormal findings: Secondary | ICD-10-CM

## 2018-05-15 NOTE — Progress Notes (Signed)
   Subjective:    Patient ID: Debra Armstrong, female    DOB: Jan 23, 1971, 47 y.o.   MRN: [id number]  Patient presents for New Patient CPE (is fasting)   Pt here for CPE   Medications and history reviewed   No PCP in past 5 years  Dr. Cherly Hensen- [facility], currently on [place] Ring     Dentist- [facility]  Dr. Blima Ledger - readings glasses only    Was seeing Dr. Genella Rife in [place], was told abnormal abnormal TFT was on methimazole  Came off due to weight gain, records   Has 75 year old Son/single    History of anemia with DUB back in 2014- did not have any biopsies, unclear why she had the severe bleeding  Occ Constipation- will use enema      Total cholesterol was mildly elevated at 218 in [place], LDL 103  She is working with nurse with [facility] to help with diet     Immunizations- UTD       Review Of Systems:  GEN- denies fatigue, fever, weight loss,weakness, recent illness HEENT- denies eye drainage, change in vision, nasal discharge, CVS- denies chest pain, palpitations RESP- denies SOB, cough, wheeze ABD- denies N/V, change in stools, abd pain GU- denies dysuria, hematuria, dribbling, incontinence MSK- denies joint pain, muscle aches, injury Neuro- denies headache, dizziness, syncope, seizure activity       Objective:    BP 102/62   Pulse 88   Temp 98.2 F (36.8 C) (Oral)   Resp 14   Ht  (1.549 m)   Wt 149 lb (67.6 kg)   LMP 05/03/2018   SpO2 99%   BMI 28.15 kg/m  GEN- NAD, alert and oriented x3 HEENT- PERRL, EOMI, non injected sclera, pink conjunctiva, MMM, oropharynx clear Neck- Supple, no thyromegaly CVS- RRR, no murmur RESP-CTAB ABD-NABS,soft,NT,ND Psych- normal affect and mood EXT- No edema Pulses- Radial, DP- 2+        Assessment & Plan:      Problem List Items Addressed This Visit    None    Visit Diagnoses    Routine general medical examination at a health care facility    -  Primary   CPE done, obtain  GYn records, immunizations UTD, fasting labs today, mild elevation in LDL/TC. Exercise, healthy diet, fiber to help bowels/cholesterol   Relevant Orders   Lipid panel (Completed)   RPR (Completed)   Comprehensive metabolic panel (Completed)   Encounter for screening for HIV       Relevant Orders   HIV antibody      Note: This dictation was prepared with Dragon dictation along with smaller phrase technology. Any transcriptional errors that result from this process are unintentional.

## 2018-05-15 NOTE — Patient Instructions (Signed)
Release  Of records- Dr. Clyd[facility]  F/U 1 year or as needed

## 2018-05-16 ENCOUNTER — Encounter: Payer: Self-pay | Admitting: *Deleted

## 2018-05-16 LAB — COMPREHENSIVE METABOLIC PANEL
AG Ratio: 1.4 (calc) (ref 1.0–2.5)
ALT: 8 U/L (ref 6–29)
AST: 10 U/L (ref 10–35)
Albumin: 4.3 g/dL (ref 3.6–5.1)
Alkaline phosphatase (APISO): 83 U/L (ref 33–115)
BUN: 11 mg/dL (ref 7–25)
CHLORIDE: 105 mmol/L (ref 98–110)
CO2: 23 mmol/L (ref 20–32)
CREATININE: 0.89 mg/dL (ref 0.50–1.10)
Calcium: 9.7 mg/dL (ref 8.6–10.2)
GLOBULIN: 3.1 g/dL (ref 1.9–3.7)
Glucose, Bld: 85 mg/dL (ref 65–99)
Potassium: 4.9 mmol/L (ref 3.5–5.3)
Sodium: 139 mmol/L (ref 135–146)
Total Bilirubin: 0.4 mg/dL (ref 0.2–1.2)
Total Protein: 7.4 g/dL (ref 6.1–8.1)

## 2018-05-16 LAB — EXTRA LAV TOP TUBE

## 2018-05-16 LAB — HIV ANTIBODY (ROUTINE TESTING W REFLEX): HIV 1&2 Ab, 4th Generation: NONREACTIVE

## 2018-05-16 LAB — RPR: RPR: NONREACTIVE

## 2018-05-16 LAB — LIPID PANEL
CHOL/HDL RATIO: 2.1 (calc) (ref <5.0)
CHOLESTEROL: 234 mg/dL — AB (ref <200)
HDL: 111 mg/dL (ref >50)
LDL CHOLESTEROL (CALC): 104 mg/dL — AB
NON-HDL CHOLESTEROL (CALC): 123 mg/dL (ref <130)
Triglycerides: 94 mg/dL (ref <150)

## 2018-05-22 ENCOUNTER — Encounter: Payer: Self-pay | Admitting: *Deleted

## 2018-05-22 DIAGNOSIS — H5201 Hypermetropia, right eye: Secondary | ICD-10-CM | POA: Diagnosis not present

## 2018-05-22 DIAGNOSIS — H52223 Regular astigmatism, bilateral: Secondary | ICD-10-CM | POA: Diagnosis not present

## 2018-05-22 DIAGNOSIS — H40033 Anatomical narrow angle, bilateral: Secondary | ICD-10-CM | POA: Diagnosis not present

## 2018-05-22 DIAGNOSIS — H524 Presbyopia: Secondary | ICD-10-CM | POA: Diagnosis not present

## 2018-05-23 ENCOUNTER — Encounter: Payer: Self-pay | Admitting: *Deleted

## 2018-07-04 DIAGNOSIS — H40033 Anatomical narrow angle, bilateral: Secondary | ICD-10-CM | POA: Diagnosis not present

## 2018-07-11 MED FILL — IBUPROFEN 800 MG TAB: 800 | 10 days supply | Qty: 30 | Fill #0

## 2018-07-12 MED FILL — NUVARING VAGINAL RING: 0.12-0.015 | 84 days supply | Qty: 3 | Fill #2

## 2018-07-15 DIAGNOSIS — H40033 Anatomical narrow angle, bilateral: Secondary | ICD-10-CM | POA: Diagnosis not present

## 2018-07-15 DIAGNOSIS — H0015 Chalazion left lower eyelid: Secondary | ICD-10-CM | POA: Diagnosis not present

## 2018-07-15 MED FILL — PREDNISOLONE AC 1% EYE DROP: 1 | 6 days supply | Qty: 5 | Fill #0

## 2018-07-15 MED FILL — ERYTHROMYCIN 0.5% EYE OINT: 5 | 10 days supply | Qty: 4 | Fill #0

## 2018-07-29 DIAGNOSIS — H40033 Anatomical narrow angle, bilateral: Secondary | ICD-10-CM | POA: Diagnosis not present

## 2018-07-29 DIAGNOSIS — H40031 Anatomical narrow angle, right eye: Secondary | ICD-10-CM | POA: Diagnosis not present

## 2018-09-05 DIAGNOSIS — H40033 Anatomical narrow angle, bilateral: Secondary | ICD-10-CM | POA: Diagnosis not present

## 2018-10-02 ENCOUNTER — Ambulatory Visit (HOSPITAL_COMMUNITY)
Admission: EM | Admit: 2018-10-02 | Discharge: 2018-10-02 | Disposition: A | Discharge status: Home / Self Care | Payer: 59 | Attending: Family Medicine | Admitting: Family Medicine

## 2018-10-02 ENCOUNTER — Encounter (HOSPITAL_COMMUNITY): Payer: Self-pay | Admitting: Emergency Medicine

## 2018-10-02 DIAGNOSIS — Z882 Allergy status to sulfonamides status: Secondary | ICD-10-CM | POA: Insufficient documentation

## 2018-10-02 DIAGNOSIS — N898 Other specified noninflammatory disorders of vagina: Secondary | ICD-10-CM | POA: Insufficient documentation

## 2018-10-02 DIAGNOSIS — Z8249 Family history of ischemic heart disease and other diseases of the circulatory system: Secondary | ICD-10-CM | POA: Insufficient documentation

## 2018-10-02 DIAGNOSIS — D649 Anemia, unspecified: Secondary | ICD-10-CM | POA: Diagnosis not present

## 2018-10-02 DIAGNOSIS — Z9049 Acquired absence of other specified parts of digestive tract: Secondary | ICD-10-CM | POA: Diagnosis not present

## 2018-10-02 DIAGNOSIS — E0590 Thyrotoxicosis, unspecified without thyrotoxic crisis or storm: Secondary | ICD-10-CM | POA: Diagnosis not present

## 2018-10-02 MED ORDER — METRONIDAZOLE 0.75 % VA GEL: 1.0000 | Freq: Every day | VAGINAL | 0 refills | Status: AC

## 2018-10-02 MED ORDER — FLUCONAZOLE 150 MG PO TABS: 150.0000 mg | ORAL_TABLET | Freq: Every day | ORAL | 0 refills | Status: DC

## 2018-10-02 MED FILL — FLUCONAZOLE 150 MG TABS: 150 | 3 days supply | Qty: 2 | Fill #0

## 2018-10-02 MED FILL — metroNIDAZOLE 0.75 % GEL: 0.75 | 5 days supply | Qty: 70 | Fill #0

## 2018-10-02 NOTE — ED Provider Notes (Signed)
[facility]    CSN: [id number] Arrival date & time: 10/02/18  1454     History   Chief Complaint No chief complaint on file.   HPI Debra Armstrong is a 47 y.o. female.   47 year old female comes in for 2 day history of vaginal discharge and lower abdominal discomfort. State vaginal discharge with odor that is similar to past BV. Denies vaginal itching, pain, spotting. Denies urinary symptoms such as frequency, dysuria, hematuria. Mild suprapubic cramping that resolved after ibuprofen. Denies nausea/vomiting. Denies fever, chills, night sweats. LMP 08/27/2018, uses nuvaring for birth control. States put in new nuvaring to prevent cycle this month. Sexually active with 1 female partner, no condom use.      Past Medical History:  Diagnosis Date  . Anemia   . Constipation, chronic   . Hyperthyroidism     There are no active problems to display for this patient.   Past Surgical History:  Procedure Laterality Date  . CESAREAN SECTION     Breech  . CHOLECYSTECTOMY      OB History    Gravida  3   Para  1   Term  1   Preterm      AB  2   Living  1     SAB  2   TAB      Ectopic      Multiple      Live Births               Home Medications    Prior to Admission medications   Medication Sig Start Date End Date Taking? Authorizing Provider  fluconazole (DIFLUCAN) 150 MG tablet Take 1 tablet (150 mg total) by mouth daily. Take second dose 72 hours later if symptoms still persists. 10/02/18   Cathie Hoops, Doral Digangi V, PA-C  metroNIDAZOLE (METROGEL VAGINAL) 0.75 % vaginal gel Place 1 Applicatorful vaginally at bedtime for 5 days. 10/02/18 10/07/18  Belinda Fisher, PA-C  NUVARING 0.12-0.015 MG/24HR vaginal ring  12/03/17   [provider]    Family History Family History  Problem Relation Age of Onset  . Diabetes Mother   . Hyperlipidemia Mother   . Hypertension Maternal Grandmother   . Glaucoma Maternal Grandmother   . Heart disease Maternal  Grandfather        Valve relacement, Bypass   . Diabetes Paternal Grandmother     Social History Social History   Tobacco Use  . Smoking status: Never Smoker  . Smokeless tobacco: Never Used  Substance Use Topics  . Alcohol use: Never    Frequency: Never  . Drug use: Never     Allergies   Sulfa antibiotics   Review of Systems Review of Systems  Reason unable to perform ROS: See HPI as above.     Physical Exam Triage Vital Signs ED Triage Vitals  Enc Vitals Group     BP 10/02/18 1515 118/80     Pulse Rate 10/02/18 1515 90     Resp 10/02/18 1515 16     Temp 10/02/18 1515 98.3 F (36.8 C)     Temp Source 10/02/18 1515 Oral     SpO2 10/02/18 1515 98 %     Weight --      Height --      Head Circumference --      Peak Flow --      Pain Score 10/02/18 1517 0     Pain Loc --  Pain Edu? --      Excl. in GC? --    No data found.  Updated Vital Signs BP 118/80 (BP Location: Right Arm)   Pulse 90   Temp 98.3 F (36.8 C) (Oral)   Resp 16   SpO2 98%   Physical Exam  Constitutional: She is oriented to person, place, and time. She appears well-developed and well-nourished. No distress.  HENT:  Head: Normocephalic and atraumatic.  Eyes: Pupils are equal, round, and reactive to light. Conjunctivae are normal.  Cardiovascular: Normal rate, regular rhythm and normal heart sounds. Exam reveals no gallop and no friction rub.  No murmur heard. Pulmonary/Chest: Effort normal and breath sounds normal. She has no wheezes. She has no rales.  Abdominal: Soft. Bowel sounds are normal. She exhibits no mass. There is no rebound, no guarding and no CVA tenderness.  Mild suprapubic tenderness without guarding or rebound  Genitourinary:  Genitourinary Comments: Patient declined.   Neurological: She is alert and oriented to person, place, and time.  Skin: Skin is warm and dry.  Psychiatric: She has a normal mood and affect. Her behavior is normal. Judgment normal.    UC  Treatments / Results  Labs (all labs ordered are listed, but only abnormal results are displayed) Labs Reviewed  CERVICOVAGINAL ANCILLARY ONLY    EKG None  Radiology No results found.  Procedures Procedures (including critical care time)  Medications Ordered in UC Medications - No data to display  Initial Impression / Assessment and Plan / UC Course  I have reviewed the triage vital signs and the nursing notes.  Pertinent labs & imaging results that were available during my care of the patient were reviewed by me and considered in my medical decision making (see chart for details).    Patient with mild suprapubic tenderness, declined pelvic at this time. Given no significant tenderness, nausea, vomiting, fever, reasonable to monitor for now. Patient was treated empirically for BV and yeast. metrogel and diflucan as directed. Cytology sent, patient will be contacted with any positive results that require additional treatment. Patient to refrain from sexual activity for the next 7 days. Return precautions given.   Final Clinical Impressions(s) / UC Diagnoses   Final diagnoses:  Vaginal discharge    ED Prescriptions    Medication Sig Dispense Auth. Provider   metroNIDAZOLE (METROGEL VAGINAL) 0.75 % vaginal gel Place 1 Applicatorful vaginally at bedtime for 5 days. 50 g Derriona Branscom V, PA-C   fluconazole (DIFLUCAN) 150 MG tablet Take 1 tablet (150 mg total) by mouth daily. Take second dose 72 hours later if symptoms still persists. 2 tablet Threasa Alpha, PA-C 10/02/18 [phone]

## 2018-10-02 NOTE — Discharge Instructions (Signed)
You were treated empirically for BV and yeast. Start metrogel and diflucan as directed. Cytology sent, you will be contacted with any positive results that requires further treatment. Refrain from sexual activity for the next 7 days. Monitor for any worsening of symptoms, fever, abdominal pain, nausea, vomiting, to follow up for reevaluation.

## 2018-10-02 NOTE — ED Notes (Signed)
Bed: UC01 Expected date: 10/02/18 Expected time:  Means of arrival:  Comments: For APPTS

## 2018-10-02 NOTE — ED Triage Notes (Addendum)
Pt c/o vaginal discharge and lower abdominal discomfort x2 days. Denies pain at this time, had mild discomfort yesterday relieved by ibuprofen.

## 2018-10-03 LAB — CERVICOVAGINAL ANCILLARY ONLY
CHLAMYDIA, DNA PROBE: NEGATIVE
Neisseria Gonorrhea: NEGATIVE
TRICH (WINDOWPATH): NEGATIVE

## 2018-10-14 MED FILL — NUVARING VAGINAL RING: 0.12-0.015 | 84 days supply | Qty: 3 | Fill #3

## 2018-12-12 MED FILL — IBUPROFEN 800 MG TAB: 800 | 10 days supply | Qty: 30 | Fill #0

## 2019-01-08 MED FILL — IBUPROFEN 800 MG TAB: 800 | 10 days supply | Qty: 30 | Fill #0

## 2019-01-10 MED FILL — ETONOGESTREL-ETHINYL ESTRAD: 0.12-0.015 | 84 days supply | Qty: 3 | Fill #0

## 2019-01-16 DIAGNOSIS — H0014 Chalazion left upper eyelid: Secondary | ICD-10-CM | POA: Diagnosis not present

## 2019-01-16 MED FILL — TOBRADEX EYE OINTMENT: 0.3-0.1 | 10 days supply | Qty: 4 | Fill #0

## 2019-02-14 DIAGNOSIS — Z1151 Encounter for screening for human papillomavirus (HPV): Secondary | ICD-10-CM | POA: Diagnosis not present

## 2019-02-14 DIAGNOSIS — Z01419 Encounter for gynecological examination (general) (routine) without abnormal findings: Secondary | ICD-10-CM | POA: Diagnosis not present

## 2019-02-14 DIAGNOSIS — Z1231 Encounter for screening mammogram for malignant neoplasm of breast: Secondary | ICD-10-CM | POA: Diagnosis not present

## 2019-02-14 LAB — HM MAMMOGRAPHY

## 2019-02-14 LAB — HM PAP SMEAR

## 2019-02-25 DIAGNOSIS — N939 Abnormal uterine and vaginal bleeding, unspecified: Secondary | ICD-10-CM | POA: Diagnosis not present

## 2019-04-08 MED FILL — ETONOGESTREL-ETHINYL ESTRAD: 0.12-0.015 | 28 days supply | Qty: 1 | Fill #0

## 2019-05-02 MED FILL — ETONOGESTREL-ETHINYL ESTRAD: 0.12-0.015 | 84 days supply | Qty: 3 | Fill #0

## 2019-05-21 ENCOUNTER — Encounter: Payer: 59 | Admitting: Family Medicine

## 2019-06-03 DIAGNOSIS — H5203 Hypermetropia, bilateral: Secondary | ICD-10-CM | POA: Diagnosis not present

## 2019-06-03 DIAGNOSIS — H40213 Acute angle-closure glaucoma, bilateral: Secondary | ICD-10-CM | POA: Diagnosis not present

## 2019-06-03 DIAGNOSIS — H52223 Regular astigmatism, bilateral: Secondary | ICD-10-CM | POA: Diagnosis not present

## 2019-06-03 DIAGNOSIS — H524 Presbyopia: Secondary | ICD-10-CM | POA: Diagnosis not present

## 2019-06-19 MED FILL — MEGESTROL 40 MG TABLET: 40 | 30 days supply | Qty: 30 | Fill #0

## 2019-07-10 ENCOUNTER — Encounter: Payer: Self-pay | Admitting: Registered"

## 2019-07-10 ENCOUNTER — Encounter: Payer: 59 | Attending: Family Medicine | Admitting: Registered"

## 2019-07-10 ENCOUNTER — Other Ambulatory Visit: Payer: Self-pay

## 2019-07-10 DIAGNOSIS — Z713 Dietary counseling and surveillance: Secondary | ICD-10-CM | POA: Insufficient documentation

## 2019-07-10 NOTE — Progress Notes (Signed)
[employer] Employee visit 1 of 3  Appt start time: 1130 end time:  1230.  Assessment:  Primary concerns today: Insurance requirement. Would like to learn more about label reading and portion control.   Employee has started [employer] month. She states she is using the app and likes that it is a customized program for her lifestyle and eating habits. Employee states she does not plan to continue [employer] for the rest of her life and wants to learn how to make healthy decisions without relying on [place] to count points.  Eats a lot of seafood (mild fish) and fruit, low points on [employer].    Employee works 3-12 hr shifts, active at work, she calculates 2-3 miles in steps.  Sleep: "on a good night" 6 hrs. Most nights has a hard time going to sleep. Wakes up 3-4 am, difficulty going back to sleep.  Stress: 3/10  (per chart family history of T2DM, did not discuss in visit)  MEDICATIONS: reviewed   DIETARY INTAKE:  Usual eating pattern includes 3 meals and 2 snacks per day.  24-hr recall:  B ( AM): yogurt (light & fit), 1/4 honey nut cheerios OR 2 boiled eggs, 2 slices (limit it to 2) Snk ( AM): fruit  L ( PM): [employer] (can count points) salad OR shrimp, tilapia, flounder Snk ( PM): fruit OR chips OR m&m (within points) OR tostitos & salsa (free points) D ( PM): seafood (occassional steak, burger red meat 3-4 oz), broccoli, rice or potatoes Snk ( PM): fruit OR crackers OR peanut m&m  Beverages: water, diet drinks, body armour  Usual physical activity: walking at least 2 miles; 2-3x week  Progress Towards Goal(s):  None set this visit    Intervention:  Nutrition Education. Label reading, sleep hygiene.   Handouts given during visit include:  Sleep Hygiene  Emailed ppt for label reading  Barriers to learning/adherence to lifestyle change: none  Demonstrated degree of understanding via:  Teach Back   Monitoring/Evaluation:  Dietary intake, exercise, and body weight in 2 week(s).

## 2019-07-10 NOTE — Patient Instructions (Signed)
Label reading powerpoint sent to your work email, review at your leisure. An interesting Visual merchandiser for the beginnings for label regulations. Consider some sleep tips for better sleep.

## 2019-07-15 ENCOUNTER — Encounter: Payer: Self-pay | Admitting: Family Medicine

## 2019-07-15 ENCOUNTER — Ambulatory Visit (INDEPENDENT_AMBULATORY_CARE_PROVIDER_SITE_OTHER): Payer: 59 | Admitting: Family Medicine

## 2019-07-15 VITALS — BP 112/64 | HR 80 | Temp 98.7°F | Resp 14 | Ht 61.0 in | Wt 145.0 lb

## 2019-07-15 DIAGNOSIS — Z113 Encounter for screening for infections with a predominantly sexual mode of transmission: Secondary | ICD-10-CM | POA: Diagnosis not present

## 2019-07-15 DIAGNOSIS — Z0000 Encounter for general adult medical examination without abnormal findings: Secondary | ICD-10-CM

## 2019-07-15 DIAGNOSIS — Z23 Encounter for immunization: Secondary | ICD-10-CM

## 2019-07-15 MED ORDER — VALACYCLOVIR HCL 1 G PO TABS: 1000.0000 mg | ORAL_TABLET | Freq: Three times a day (TID) | ORAL | 1 refills | Status: DC

## 2019-07-15 MED FILL — valACYclovir HCL 1 GM TABS: 1 | 10 days supply | Qty: 30 | Fill #0

## 2019-07-15 NOTE — Progress Notes (Signed)
   Subjective:    Patient ID: Debra Armstrong, female    DOB: 1971/08/02, 48 y.o.   MRN: [id number]  Patient presents for Annual Exam (is fasting)     Pt here for CPE,  GYN- Dr. 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 Would like valtrex on hand and titers drawn, neg HIV testing last year with labs Due for TDAP    No current meds Family history updated  She is following [employer] intentionally trying to lose weight     Review Of Systems:  GEN- denies fatigue, fever, weight loss,weakness, recent illness HEENT- denies eye drainage, change in vision, nasal discharge, CVS- denies chest pain, palpitations RESP- denies SOB, cough, wheeze ABD- denies N/V, change in stools, abd pain GU- denies dysuria, hematuria, dribbling, incontinence MSK- denies joint pain, muscle aches, injury Neuro- denies headache, dizziness, syncope, seizure activity       Objective:    BP 112/64   Pulse 80   Temp 98.7 F (37.1 C) (Oral)   Resp 14   Ht 5\' 1"  (1.549 m)   Wt 145 lb (65.8 kg)   SpO2 98%   BMI 27.40 kg/m  GEN- NAD, alert and oriented x3 HEENT- PERRL, EOMI, non injected sclera, pink conjunctiva, MMM, oropharynx clear Neck- Supple, no thyromegaly CVS- RRR, no murmur RESP-CTAB ABD-NABS,soft,NT,ND Ext- no edema  Pulses- Radial, DP- 2+        Assessment & Plan:      Problem List Items Addressed This Visit    None    Visit Diagnoses    Routine general medical examination at a health care facility    -  Primary   CPE done obtain GYn records , PAP, mammogram, TDAP given, fasting labs, given valtrex for flares BID for 7-10 days.    Relevant Orders   CBC with Differential/Platelet   Comprehensive metabolic panel   Lipid panel   Screen for STD (sexually transmitted disease)       Relevant Orders   HSV(herpes simplex vrs) 1+2  ab-IgG   HIV Antibody (routine testing w rflx)   RPR   Need for tetanus, diphtheria, and acellular pertussis (Tdap) vaccine in patient of adolescent age or older          Note: This dictation was prepared with Diplomatic Services operational officer dictation along with smaller Company secretary. Any transcriptional errors that result from this process are unintentional.

## 2019-07-15 NOTE — Patient Instructions (Addendum)
I recommend eye visit once a year I recommend dental visit every 6 months Goal is to  Exercise 30 minutes 5 days a week We will call with lab results  F/U 1 year for physical  fOR Valtrex take 1 tablet twice a day for 7-10 days as needed for flares ( the bottle has different instructions to give you more tablets)

## 2019-07-16 LAB — LIPID PANEL
Cholesterol: 193 mg/dL (ref <200)
HDL: 70 mg/dL (ref >=50)
LDL Cholesterol (Calc): 110 mg/dL (calc) — ABNORMAL HIGH
Non-HDL Cholesterol (Calc): 123 mg/dL (calc) (ref <130)
Total CHOL/HDL Ratio: 2.8 (calc) (ref <5.0)
Triglycerides: 43 mg/dL (ref <150)

## 2019-07-16 LAB — COMPREHENSIVE METABOLIC PANEL
AG Ratio: 1.5 (calc) (ref 1.0–2.5)
ALT: 12 U/L (ref 6–29)
AST: 18 U/L (ref 10–35)
Albumin: 4.5 g/dL (ref 3.6–5.1)
Alkaline phosphatase (APISO): 80 U/L (ref 31–125)
BUN: 10 mg/dL (ref 7–25)
CO2: 22 mmol/L (ref 20–32)
Calcium: 10.1 mg/dL (ref 8.6–10.2)
Chloride: 104 mmol/L (ref 98–110)
Creat: 0.93 mg/dL (ref 0.50–1.10)
Globulin: 3 g/dL (calc) (ref 1.9–3.7)
Glucose, Bld: 81 mg/dL (ref 65–99)
Potassium: 4.5 mmol/L (ref 3.5–5.3)
Sodium: 138 mmol/L (ref 135–146)
Total Bilirubin: 0.8 mg/dL (ref 0.2–1.2)
Total Protein: 7.5 g/dL (ref 6.1–8.1)

## 2019-07-16 LAB — CBC WITH DIFFERENTIAL/PLATELET
Absolute Monocytes: 448 cells/uL (ref 200–950)
Basophils Absolute: 49 cells/uL (ref 0–200)
Basophils Relative: 1.3 %
Eosinophils Absolute: 91 cells/uL (ref 15–500)
Eosinophils Relative: 2.4 %
HCT: 41.6 % (ref 35.0–45.0)
Hemoglobin: 14.2 g/dL (ref 11.7–15.5)
Lymphs Abs: 1680 cells/uL (ref 850–3900)
MCH: 31.6 pg (ref 27.0–33.0)
MCHC: 34.1 g/dL (ref 32.0–36.0)
MCV: 92.7 fL (ref 80.0–100.0)
MPV: 10 fL (ref 7.5–12.5)
Monocytes Relative: 11.8 %
Neutro Abs: 1531 cells/uL (ref 1500–7800)
Neutrophils Relative %: 40.3 %
Platelets: 348 10*3/uL (ref 140–400)
RBC: 4.49 10*6/uL (ref 3.80–5.10)
RDW: 11.6 % (ref 11.0–15.0)
Total Lymphocyte: 44.2 %
WBC: 3.8 10*3/uL (ref 3.8–10.8)

## 2019-07-16 LAB — HSV(HERPES SIMPLEX VRS) I + II AB-IGG
HAV 1 IGG,TYPE SPECIFIC AB: 52 index — ABNORMAL HIGH
HSV 2 IGG,TYPE SPECIFIC AB: 9.77 index — ABNORMAL HIGH

## 2019-07-16 LAB — HIV ANTIBODY (ROUTINE TESTING W REFLEX): HIV 1&2 Ab, 4th Generation: NONREACTIVE

## 2019-07-16 LAB — RPR: RPR Ser Ql: NONREACTIVE

## 2019-07-18 MED FILL — PHENAZOPYRIDINE 200 MG TAB: 200 | 1 days supply | Qty: 1 | Fill #0

## 2019-07-22 ENCOUNTER — Encounter: Payer: Self-pay | Admitting: *Deleted

## 2019-07-24 ENCOUNTER — Encounter: Payer: 59 | Attending: Family Medicine | Admitting: Registered"

## 2019-07-24 ENCOUNTER — Encounter: Payer: Self-pay | Admitting: Registered"

## 2019-07-24 ENCOUNTER — Other Ambulatory Visit: Payer: Self-pay

## 2019-07-24 DIAGNOSIS — Z713 Dietary counseling and surveillance: Secondary | ICD-10-CM | POA: Insufficient documentation

## 2019-07-24 NOTE — Progress Notes (Signed)
[employer] Employee visit 2 of 3  Appt start time: 1200 end time:  1230.  Assessment:  Primary concerns today: Insurance requirement. Would like to learn more about Mindful eating this visit.   Employee states she usually eats while doing other things such as working, watching TV, or doing household chores.   Dinner routine: On days she works (12-hr shifts) she stops on the way home to pick up food and usually eats right when she gets home around 8 pm. On days off in the morning she'll think about what she is going to prepare for dinner and has no problem going to the store to pick up whatever ingredients she doesn't have on hand. Today she plans to have steak, potatoes, broccoli.  Lunch at work: Employee states she usually doesn't get to take 30 min lunch break during her 12-hour shift. Employee stated that recently received an email stating that too many people were not taking lunch breaks and was irritated by the message, because it is not her choice if there isn't an opportunity, self-care is secondary to patient care and there is not enough staff for both to happen. Employee packs fruits and veggies for snacks at work. This is a recent change since joining [employer]. Used to snack on things such as chips.   Asked employee for update on sleep: Employee states still having trouble with waking up during the night. It has been this way so long that she states she feels she's adjusted to it. Tried spraying lavender scent on pillow, but didn't seem to make much difference.  MEDICATIONS: reviewed   DIETARY INTAKE: not assessed this visit  Progress Towards Goal(s):  No goal set first visit    Intervention:  Nutrition Education. Mindful eating  Plan: [email or web address] raisin meditation - can do with M&Ms Body scan also might be helpful when you wake up during the night Consider eating your dinners on your days off as mindfully as possible Pay attention to how the flavor of food changes the  more you eat of it  Handouts given during visit include:  Am I hungry?  Needs/Feelings  website for mindfulness (palouse)  Barriers to learning/adherence to lifestyle change: none  Demonstrated degree of understanding via:  Teach Back   Monitoring/Evaluation:  Dietary intake, exercise, and body weight in 2 week(s).

## 2019-07-24 NOTE — Patient Instructions (Addendum)
[email or web address] raisin meditation - can do with M&Ms Body scan also might be helpful when you wake up during the night Consider eating your dinners on your days off as mindfully as possible Pay attention to how the flavor of food changes the more you eat of it

## 2019-07-30 ENCOUNTER — Other Ambulatory Visit: Payer: Self-pay | Admitting: *Deleted

## 2019-07-30 MED ORDER — VALACYCLOVIR HCL 500 MG PO TABS: 500.0000 mg | ORAL_TABLET | Freq: Every day | ORAL | 3 refills | Status: DC

## 2019-07-30 MED FILL — VALACYCLOVIR HCL 500 MG TAB: 500 | 90 days supply | Qty: 90 | Fill #0

## 2019-08-06 ENCOUNTER — Ambulatory Visit: Payer: 59 | Admitting: Registered"

## 2019-09-02 ENCOUNTER — Encounter: Payer: Self-pay | Admitting: *Deleted

## 2019-09-05 ENCOUNTER — Telehealth: Payer: Self-pay | Admitting: Family Medicine

## 2019-09-05 NOTE — Telephone Encounter (Signed)
Received paperwork from patient will route to christina for completion

## 2019-09-05 NOTE — Telephone Encounter (Signed)
Faxed completed copy and patient made aware.   Will pick up copy on Monday.

## 2019-09-05 NOTE — Telephone Encounter (Signed)
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   Completed and routed to provider for signature.

## 2019-09-18 ENCOUNTER — Ambulatory Visit (HOSPITAL_BASED_OUTPATIENT_CLINIC_OR_DEPARTMENT_OTHER): Admit: 2019-09-18 | Payer: 59 | Admitting: Obstetrics and Gynecology

## 2019-09-18 ENCOUNTER — Encounter (HOSPITAL_BASED_OUTPATIENT_CLINIC_OR_DEPARTMENT_OTHER): Payer: Self-pay

## 2019-09-18 SURGERY — XI ROBOTIC ASSISTED LAPAROSCOPIC HYSTERECTOMY AND SALPINGECTOMY
Anesthesia: General | Laterality: Bilateral

## 2019-11-05 MED FILL — IBUPROFEN 800 MG TABS: 800 | 10 days supply | Qty: 30 | Fill #0

## 2019-11-17 MED FILL — IBUPROFEN 800 MG TABS: 800 | 10 days supply | Qty: 30 | Fill #0

## 2020-02-12 ENCOUNTER — Encounter (HOSPITAL_COMMUNITY): Payer: Self-pay

## 2020-02-12 ENCOUNTER — Ambulatory Visit (HOSPITAL_COMMUNITY)
Admission: EM | Admit: 2020-02-12 | Discharge: 2020-02-12 | Disposition: A | Discharge status: Home / Self Care | Payer: 59 | Attending: Family Medicine | Admitting: Family Medicine

## 2020-02-12 ENCOUNTER — Other Ambulatory Visit: Payer: Self-pay

## 2020-02-12 DIAGNOSIS — N390 Urinary tract infection, site not specified: Secondary | ICD-10-CM | POA: Diagnosis not present

## 2020-02-12 LAB — POCT URINALYSIS DIP (DEVICE)
Bilirubin Urine: NEGATIVE
Glucose, UA: NEGATIVE mg/dL
Hgb urine dipstick: NEGATIVE
Ketones, ur: NEGATIVE mg/dL
Nitrite: NEGATIVE
Protein, ur: NEGATIVE mg/dL
Specific Gravity, Urine: 1.025 (ref 1.005–1.030)
Urobilinogen, UA: 1 mg/dL (ref 0.0–1.0)
pH: 7 (ref 5.0–8.0)

## 2020-02-12 MED ORDER — NITROFURANTOIN MONOHYD MACRO 100 MG PO CAPS: 100.0000 mg | ORAL_CAPSULE | Freq: Two times a day (BID) | ORAL | 0 refills | Status: DC

## 2020-02-12 MED FILL — NITROFURANTOIN MONO-MCR 100: 100 | 5 days supply | Qty: 10 | Fill #0

## 2020-02-12 NOTE — Discharge Instructions (Signed)
Take the nitrofurantoin 2 x a day for 5 days Continue to push fluids Your results will be available in My Chart

## 2020-02-12 NOTE — ED Triage Notes (Signed)
Pt c/o frequency, urgency with urination and malodorous urine x approx 1 week. Denies burning with urination. Denies abd pain, flank pain, fever, chills, or n/v.   Also request eval for chlamydia, GC as well.

## 2020-02-12 NOTE — ED Provider Notes (Signed)
[facility]    CSN: [id number] Arrival date & time: 02/12/20  1253      History   Chief Complaint Chief Complaint  Patient presents with  . Dysuria    HPI Debra Armstrong is a 48 y.o. female.   HPI  Patient is here for evaluation for urinary tract infection.  She believes she has a bladder infection.  She has frequency of urination, urgency, malodorous odorous urine for a week.  No dysuria.  She feels like she needs to go to the bathroom urgently, and then very little urine comes out.  No flank pain or back pain, fever or chills, nausea or vomiting, no abdominal pain. While she is here she would like to have a vaginal swab for vaginitis/STD evaluation.  She has unprotected intercourse.  Does not need pregnancy testing.  Past Medical History:  Diagnosis Date  . Anemia   . Constipation, chronic   . Hyperthyroidism     There are no problems to display for this patient.   Past Surgical History:  Procedure Laterality Date  . CESAREAN SECTION     Breech  . CHOLECYSTECTOMY      OB History    Gravida  3   Para  1   Term  1   Preterm      AB  2   Living  1     SAB  2   TAB      Ectopic      Multiple      Live Births               Home Medications    Prior to Admission medications   Medication Sig Start Date End Date Taking? Authorizing Provider  valACYclovir (VALTREX) 500 MG tablet Take 1 tablet (500 mg total) by mouth daily. 07/30/19  Yes Dozier, Modena Nunnery, MD  ibuprofen (ADVIL) 800 MG tablet Take 800 mg by mouth 3 (three) times daily. 11/17/19   [provider]  nitrofurantoin, macrocrystal-monohydrate, (MACROBID) 100 MG capsule Take 1 capsule (100 mg total) by mouth 2 (two) times daily. 02/12/20   Raylene Everts, MD    Family History Family History  Problem Relation Age of Onset  . Diabetes Mother   . Hyperlipidemia Mother   . Hypertension Maternal Grandmother   . Glaucoma Maternal Grandmother   . Heart disease  Maternal Grandfather        Valve relacement, Bypass   . Diabetes Paternal Grandmother     Social History Social History   Tobacco Use  . Smoking status: Never Smoker  . Smokeless tobacco: Never Used  Substance Use Topics  . Alcohol use: Never  . Drug use: Never     Allergies   Sulfa antibiotics   Review of Systems Review of Systems  Constitutional: Negative for chills and fever.  Genitourinary: Positive for frequency and urgency. Negative for difficulty urinating, dysuria, flank pain, hematuria, menstrual problem and pelvic pain.  Musculoskeletal: Negative for back pain.     Physical Exam Triage Vital Signs ED Triage Vitals  Enc Vitals Group     BP 02/12/20 1311 102/66     Pulse Rate 02/12/20 1311 72     Resp 02/12/20 1311 18     Temp 02/12/20 1311 98.2 F (36.8 C)     Temp Source 02/12/20 1311 Oral     SpO2 02/12/20 1311 100 %     Weight --      Height --  Head Circumference --      Peak Flow --      Pain Score 02/12/20 1308 0     Pain Loc --      Pain Edu? --      Excl. in GC? --    No data found.  Updated Vital Signs BP 102/66 (BP Location: Left Arm)   Pulse 72   Temp 98.2 F (36.8 C) (Oral)   Resp 18   LMP 10/12/2019 (Approximate)   SpO2 100%      Physical Exam Constitutional:      General: She is not in acute distress.    Appearance: She is well-developed.     Comments: Exam by observation  HENT:     Head: Normocephalic and atraumatic.  Eyes:     Conjunctiva/sclera: Conjunctivae normal.     Pupils: Pupils are equal, round, and reactive to light.  Cardiovascular:     Rate and Rhythm: Normal rate.  Pulmonary:     Effort: Pulmonary effort is normal. No respiratory distress.  Genitourinary:    Comments: Self swab obtained Musculoskeletal:        General: Normal range of motion.     Cervical back: Normal range of motion.  Skin:    General: Skin is warm and dry.  Neurological:     Mental Status: She is alert.  Psychiatric:         Mood and Affect: Mood normal.        Behavior: Behavior normal.      UC Treatments / Results  Labs (all labs ordered are listed, but only abnormal results are displayed) Labs Reviewed  POCT URINALYSIS DIP (DEVICE) - Abnormal; Notable for the following components:      Result Value   Leukocytes,Ua SMALL (*)    All other components within normal limits  URINE CULTURE  CERVICOVAGINAL ANCILLARY ONLY    EKG   Radiology No results found.  Procedures Procedures (including critical care time)  Medications Ordered in UC Medications - No data to display  Initial Impression / Assessment and Plan / UC Course  I have reviewed the triage vital signs and the nursing notes.  Pertinent labs & imaging results that were available during my care of the patient were reviewed by me and considered in my medical decision making (see chart for details).     Testing done patient will check my chart for results Final Clinical Impressions(s) / UC Diagnoses   Final diagnoses:  Lower urinary tract infectious disease     Discharge Instructions     Take the nitrofurantoin 2 x a day for 5 days Continue to push fluids Your results will be available in My Chart   ED Prescriptions    Medication Sig Dispense Auth. Provider   nitrofurantoin, macrocrystal-monohydrate, (MACROBID) 100 MG capsule Take 1 capsule (100 mg total) by mouth 2 (two) times daily. 10 capsule Eustace Moore, MD     PDMP not reviewed this encounter.   Eustace Moore, MD 02/12/20 1452

## 2020-02-14 LAB — URINE CULTURE
Culture: >=100000 — AB
Special Requests: NORMAL

## 2020-02-16 LAB — CERVICOVAGINAL ANCILLARY ONLY
Bacterial vaginitis: NEGATIVE
Candida vaginitis: NEGATIVE
Chlamydia: NEGATIVE
Neisseria Gonorrhea: NEGATIVE
Trichomonas: NEGATIVE

## 2020-03-03 ENCOUNTER — Encounter: Payer: Self-pay | Admitting: *Deleted

## 2020-03-10 DIAGNOSIS — Z01419 Encounter for gynecological examination (general) (routine) without abnormal findings: Secondary | ICD-10-CM | POA: Diagnosis not present

## 2020-03-10 DIAGNOSIS — Z13220 Encounter for screening for lipoid disorders: Secondary | ICD-10-CM | POA: Diagnosis not present

## 2020-03-10 DIAGNOSIS — Z131 Encounter for screening for diabetes mellitus: Secondary | ICD-10-CM | POA: Diagnosis not present

## 2020-03-10 DIAGNOSIS — Z0000 Encounter for general adult medical examination without abnormal findings: Secondary | ICD-10-CM | POA: Diagnosis not present

## 2020-03-10 DIAGNOSIS — Z1151 Encounter for screening for human papillomavirus (HPV): Secondary | ICD-10-CM | POA: Diagnosis not present

## 2020-03-10 DIAGNOSIS — Z1231 Encounter for screening mammogram for malignant neoplasm of breast: Secondary | ICD-10-CM | POA: Diagnosis not present

## 2020-03-10 DIAGNOSIS — E049 Nontoxic goiter, unspecified: Secondary | ICD-10-CM | POA: Diagnosis not present

## 2020-03-10 DIAGNOSIS — Z130 Encounter for screening for diseases of the blood and blood-forming organs and certain disorders involving the immune mechanism: Secondary | ICD-10-CM | POA: Diagnosis not present

## 2020-03-10 DIAGNOSIS — Z6823 Body mass index (BMI) 23.0-23.9, adult: Secondary | ICD-10-CM | POA: Diagnosis not present

## 2020-03-25 DIAGNOSIS — K5904 Chronic idiopathic constipation: Secondary | ICD-10-CM | POA: Diagnosis not present

## 2020-03-25 DIAGNOSIS — Z1211 Encounter for screening for malignant neoplasm of colon: Secondary | ICD-10-CM | POA: Diagnosis not present

## 2020-03-25 DIAGNOSIS — R140 Abdominal distension (gaseous): Secondary | ICD-10-CM | POA: Diagnosis not present

## 2020-03-25 MED FILL — SUPREP BOWEL PREP KIT: 17.5-3.13-1 | 2 days supply | Qty: 354 | Fill #0

## 2020-04-06 ENCOUNTER — Other Ambulatory Visit: Payer: Self-pay | Admitting: Obstetrics and Gynecology

## 2020-04-06 DIAGNOSIS — E049 Nontoxic goiter, unspecified: Secondary | ICD-10-CM

## 2020-04-13 MED FILL — valACYclovir HCL 1 GM TABS: 1 | 10 days supply | Qty: 30 | Fill #1

## 2020-04-15 ENCOUNTER — Other Ambulatory Visit: Payer: Self-pay

## 2020-04-15 ENCOUNTER — Ambulatory Visit
Admission: RE | Admit: 2020-04-15 | Discharge: 2020-04-15 | Disposition: A | Discharge status: Home / Self Care | Payer: 59 | Source: Ambulatory Visit | Attending: Obstetrics and Gynecology | Admitting: Obstetrics and Gynecology

## 2020-04-15 DIAGNOSIS — E049 Nontoxic goiter, unspecified: Secondary | ICD-10-CM

## 2020-04-15 DIAGNOSIS — E041 Nontoxic single thyroid nodule: Secondary | ICD-10-CM | POA: Diagnosis not present

## 2020-04-30 DIAGNOSIS — Z1211 Encounter for screening for malignant neoplasm of colon: Secondary | ICD-10-CM | POA: Diagnosis not present

## 2020-04-30 LAB — HM COLONOSCOPY

## 2020-05-03 MED FILL — VALACYCLOVIR HCL 500 MG TAB: 500 | 90 days supply | Qty: 90 | Fill #1

## 2020-05-06 ENCOUNTER — Encounter: Payer: Self-pay | Admitting: *Deleted

## 2020-07-19 ENCOUNTER — Encounter: Payer: Self-pay | Admitting: Family Medicine

## 2020-07-19 ENCOUNTER — Other Ambulatory Visit: Payer: Self-pay

## 2020-07-19 ENCOUNTER — Ambulatory Visit (INDEPENDENT_AMBULATORY_CARE_PROVIDER_SITE_OTHER): Payer: 59 | Admitting: Family Medicine

## 2020-07-19 VITALS — BP 104/60 | HR 66 | Temp 97.9°F | Resp 12 | Ht 61.0 in | Wt 125.0 lb

## 2020-07-19 DIAGNOSIS — Z0000 Encounter for general adult medical examination without abnormal findings: Secondary | ICD-10-CM

## 2020-07-19 DIAGNOSIS — Z8639 Personal history of other endocrine, nutritional and metabolic disease: Secondary | ICD-10-CM

## 2020-07-19 DIAGNOSIS — Z0001 Encounter for general adult medical examination with abnormal findings: Secondary | ICD-10-CM

## 2020-07-19 DIAGNOSIS — Z1159 Encounter for screening for other viral diseases: Secondary | ICD-10-CM

## 2020-07-19 NOTE — Progress Notes (Signed)
   Subjective:    Patient ID: Debra Armstrong, female    DOB: 11-11-1971, 49 y.o.   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 Sherryll Burger- had one lesion excised Wanted to know if there was anything she can do to preven them. Note around that time there was concern for narrow angle glaucoma, she had procedure done to improve her pressures in 2019  Grandmother maternal  Hx of glaucoma    She had was seen by GYN- felt thyroid was enlarged US showed concern for thyroiditis, though she was not symptomatic for it,    Needs thyroid labs today    PAP UTD/MAMMOGRAM UTD   Colonoscopy - Done 2020, normal    No current meds    She is down 30lbs, with weight watchers  she is no longer measuring foods, but watches overall portions and sweets         Review Of Systems:  GEN- denies fatigue, fever, weight loss,weakness, recent illness HEENT- denies eye drainage, change in vision, nasal discharge, CVS- denies chest pain, palpitations RESP- denies SOB, cough, wheeze ABD- denies N/V, change in stools, abd pain GU- denies dysuria, hematuria, dribbling, incontinence MSK- denies joint pain, muscle aches, injury Neuro- denies headache, dizziness, syncope, seizure activity       Objective:    BP (!) 104/60   Pulse 66   Temp 97.9 F (36.6 C) (Temporal)   Resp 12   Ht 5\' 1"  (1.549 m)   Wt 125 lb (56.7 kg)   SpO2 100%   BMI 23.62 kg/m  GEN- NAD, alert and oriented x3 HEENT- PERRL, EOMI, non injected sclera, pink conjunctiva,TM clear no effusion Neck- Supple, no thyromegaly CVS- RRR, no murmur RESP-CTAB ABD-NABS,soft,NT,ND EXT- No edema Pulses- Radial, DP- 2+  FALL/DEPRESSION/AUDIT C negative        Assessment & Plan:      Problem List Items Addressed This Visit    None    Visit Diagnoses    Routine general medical examination at a health care facility    -  Primary   CPE done, screen Hep C, obtain info from GYN and previous eye doctor, use Baby shampoo to clean lids,lashes. For thyroiditis concern, recheck TFT    Relevant Orders   CBC with Differential/Platelet   Comprehensive metabolic panel   Lipid panel   History of thyroid disease       Relevant Orders   TSH   T3, free   T4, free   Need for hepatitis C screening test       Relevant Orders   Hepatitis C antibody      Note: This dictation was prepared with Dragon dictation along with smaller phrase technology. Any transcriptional errors that result from this process are unintentional.

## 2020-07-19 NOTE — Patient Instructions (Addendum)
F/U 1 year for Physical  We will call with labs

## 2020-07-20 LAB — COMPREHENSIVE METABOLIC PANEL
AG Ratio: 1.6 (calc) (ref 1.0–2.5)
ALT: 8 U/L (ref 6–29)
AST: 13 U/L (ref 10–35)
Albumin: 4.5 g/dL (ref 3.6–5.1)
Alkaline phosphatase (APISO): 92 U/L (ref 31–125)
BUN: 9 mg/dL (ref 7–25)
CO2: 26 mmol/L (ref 20–32)
Calcium: 10 mg/dL (ref 8.6–10.2)
Chloride: 104 mmol/L (ref 98–110)
Creat: 0.85 mg/dL (ref 0.50–1.10)
Globulin: 2.8 g/dL (calc) (ref 1.9–3.7)
Glucose, Bld: 79 mg/dL (ref 65–99)
Potassium: 4.6 mmol/L (ref 3.5–5.3)
Sodium: 138 mmol/L (ref 135–146)
Total Bilirubin: 0.5 mg/dL (ref 0.2–1.2)
Total Protein: 7.3 g/dL (ref 6.1–8.1)

## 2020-07-20 LAB — LIPID PANEL
Cholesterol: 194 mg/dL (ref <200)
HDL: 95 mg/dL (ref >=50)
LDL Cholesterol (Calc): 87 mg/dL (calc)
Non-HDL Cholesterol (Calc): 99 mg/dL (calc) (ref <130)
Total CHOL/HDL Ratio: 2 (calc) (ref <5.0)
Triglycerides: 41 mg/dL (ref <150)

## 2020-07-20 LAB — CBC WITH DIFFERENTIAL/PLATELET
Absolute Monocytes: 572 cells/uL (ref 200–950)
Basophils Absolute: 70 cells/uL (ref 0–200)
Basophils Relative: 1.6 %
Eosinophils Absolute: 101 cells/uL (ref 15–500)
Eosinophils Relative: 2.3 %
HCT: 42.9 % (ref 35.0–45.0)
Hemoglobin: 14.2 g/dL (ref 11.7–15.5)
Lymphs Abs: 1945 cells/uL (ref 850–3900)
MCH: 31.9 pg (ref 27.0–33.0)
MCHC: 33.1 g/dL (ref 32.0–36.0)
MCV: 96.4 fL (ref 80.0–100.0)
MPV: 9.5 fL (ref 7.5–12.5)
Monocytes Relative: 13 %
Neutro Abs: 1712 cells/uL (ref 1500–7800)
Neutrophils Relative %: 38.9 %
Platelets: 299 10*3/uL (ref 140–400)
RBC: 4.45 10*6/uL (ref 3.80–5.10)
RDW: 11.9 % (ref 11.0–15.0)
Total Lymphocyte: 44.2 %
WBC: 4.4 10*3/uL (ref 3.8–10.8)

## 2020-07-20 LAB — T4, FREE: Free T4: 1 ng/dL (ref 0.8–1.8)

## 2020-07-20 LAB — HEPATITIS C ANTIBODY
Hepatitis C Ab: NONREACTIVE
SIGNAL TO CUT-OFF: 0.01 (ref <1.00)

## 2020-07-20 LAB — TSH: TSH: 0.94 mIU/L

## 2020-07-20 LAB — T3, FREE: T3, Free: 2.6 pg/mL (ref 2.3–4.2)

## 2020-08-17 DIAGNOSIS — H524 Presbyopia: Secondary | ICD-10-CM | POA: Diagnosis not present

## 2020-08-17 DIAGNOSIS — H5203 Hypermetropia, bilateral: Secondary | ICD-10-CM | POA: Diagnosis not present

## 2020-08-17 DIAGNOSIS — H40213 Acute angle-closure glaucoma, bilateral: Secondary | ICD-10-CM | POA: Diagnosis not present

## 2020-08-17 DIAGNOSIS — H52223 Regular astigmatism, bilateral: Secondary | ICD-10-CM | POA: Diagnosis not present

## 2021-01-03 ENCOUNTER — Encounter: Payer: Self-pay | Admitting: Family Medicine

## 2021-01-04 ENCOUNTER — Other Ambulatory Visit: Payer: Self-pay | Admitting: Family Medicine

## 2021-01-04 MED ORDER — VALACYCLOVIR HCL 500 MG PO TABS
500.0000 mg | ORAL_TABLET | Freq: Every day | ORAL | 3 refills | Status: DC
Start: 2021-01-04 — End: 2021-01-04

## 2021-01-04 MED ORDER — VALACYCLOVIR HCL 1 G PO TABS: 1000.0000 mg | ORAL_TABLET | Freq: Three times a day (TID) | ORAL | 0 refills | Status: DC

## 2021-01-04 MED FILL — VALACYCLOVIR HCL 500 MG TAB: 500 | 30 days supply | Qty: 30 | Fill #0

## 2021-01-04 MED FILL — valACYclovir HCL 1 GM TABS: 1 | 7 days supply | Qty: 21 | Fill #0

## 2021-01-25 IMAGING — US US THYROID
1 series · 13 of 25 positions shown · non-contrast
Comparison: None.

CLINICAL DATA: Palpable abnormality. Thyromegaly on physical
examination.

EXAM:
THYROID ULTRASOUND
TECHNIQUE: Ultrasound examination of the thyroid gland and adjacent soft
tissues was performed.

[Series 1: us thyroid · 0.05mm/px · 58 acquisitions, 13 frames shown]
[im 1/58]
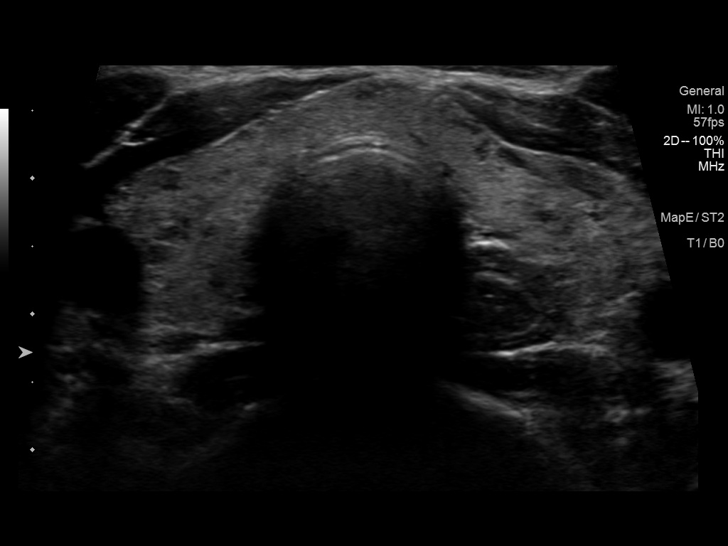
[im 5/58]
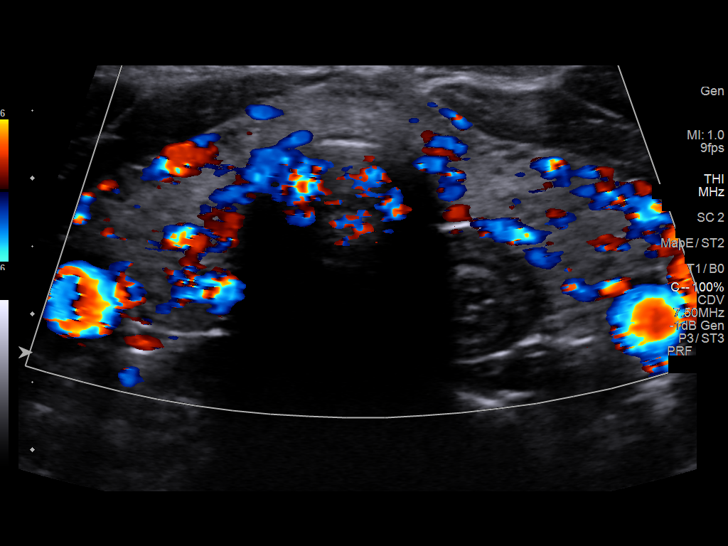
[im 10/58]
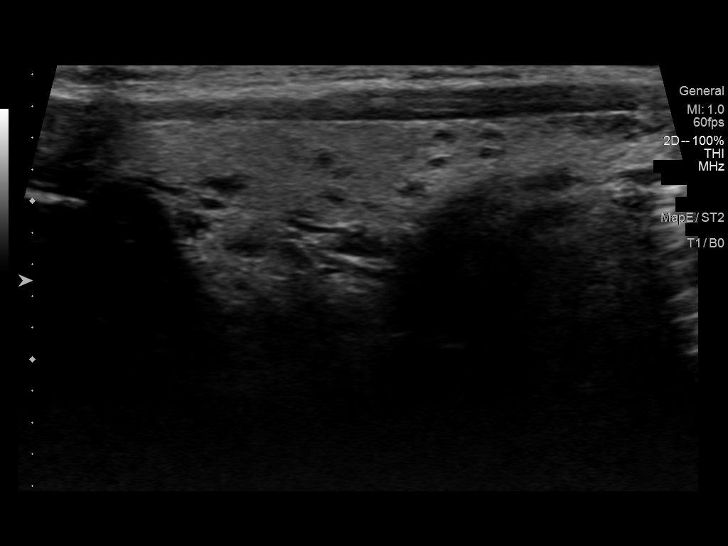
[im 15/58]
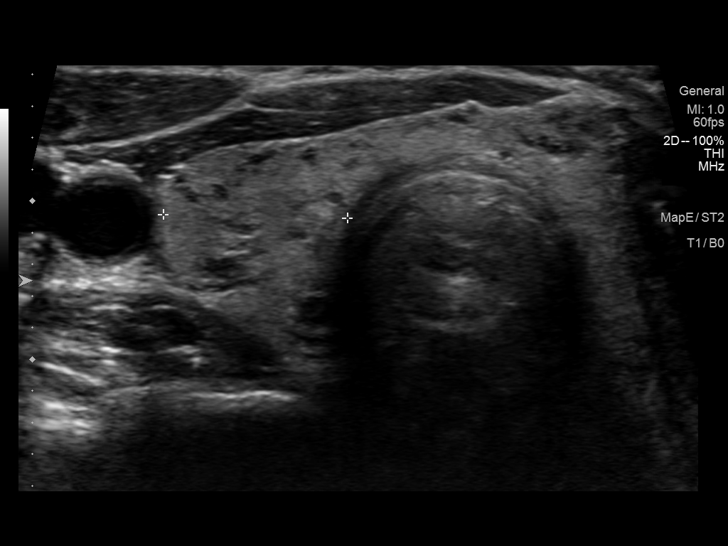
[im 20/58]
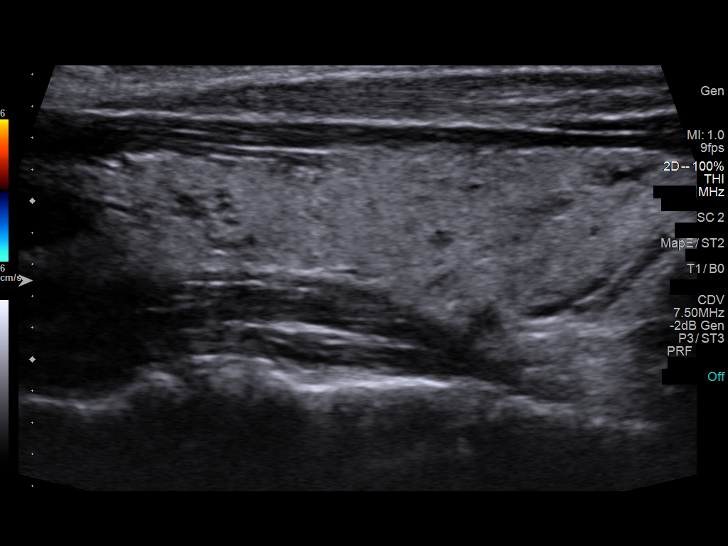
[im 24/58]
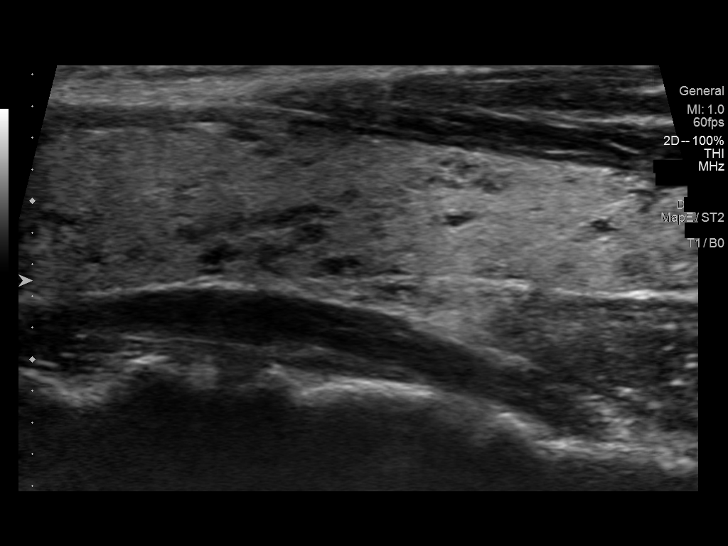
[im 29/58]
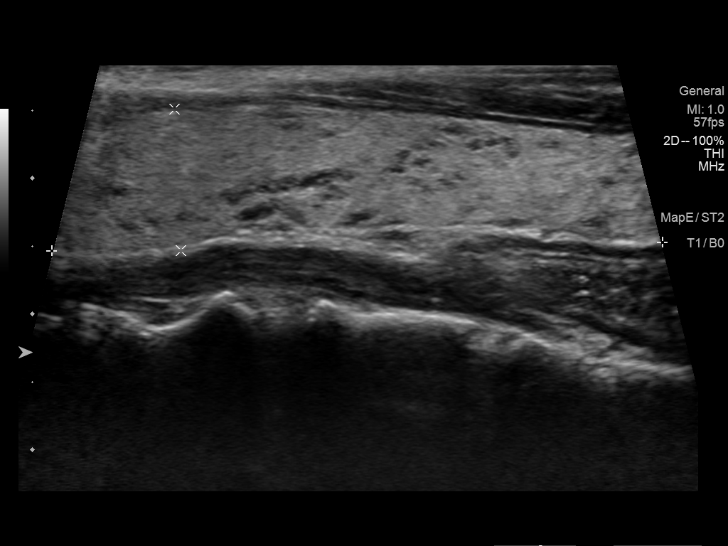
[im 34/58]
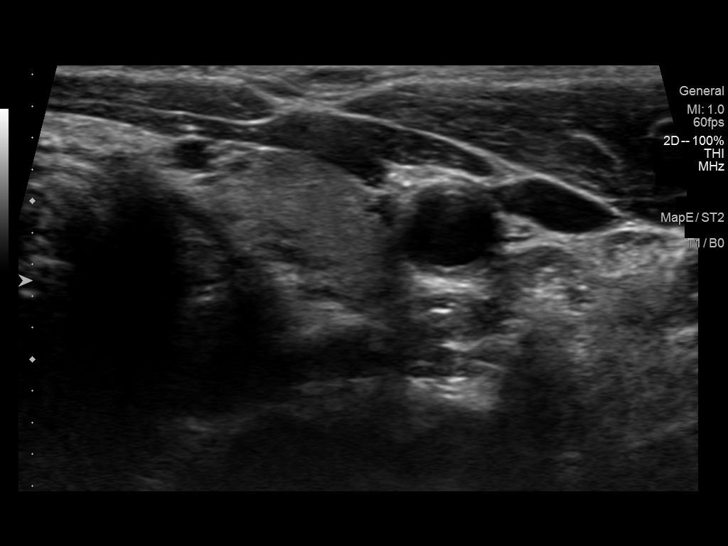
[im 39/58]
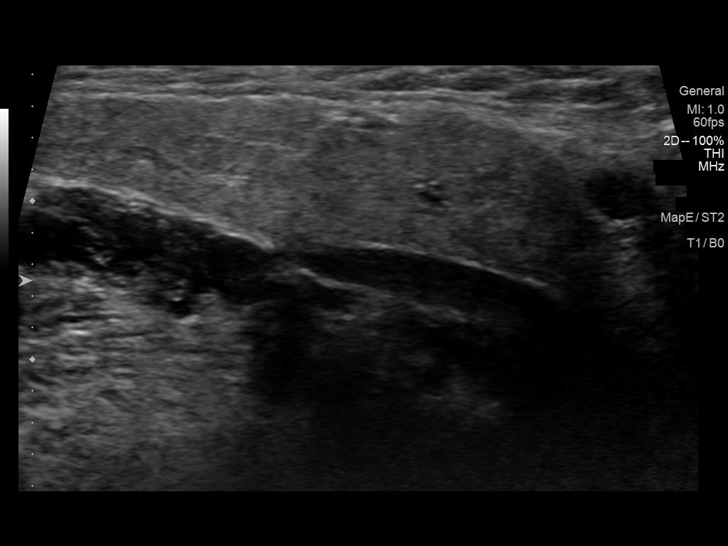
[im 43/58]
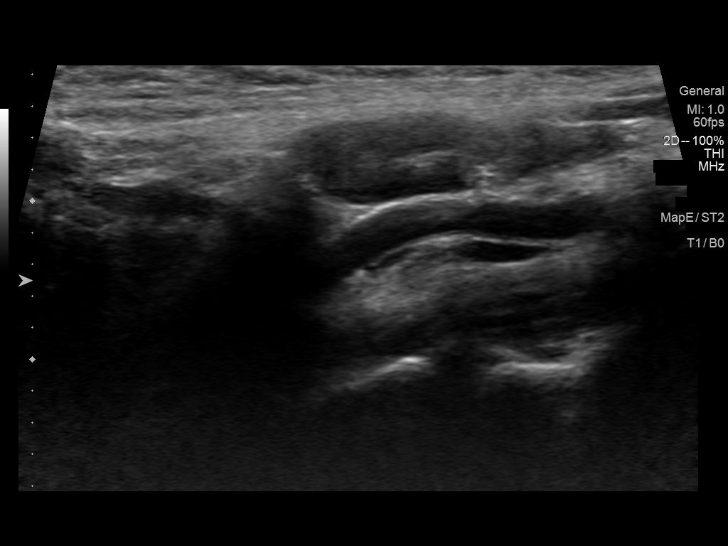
[im 48/58]
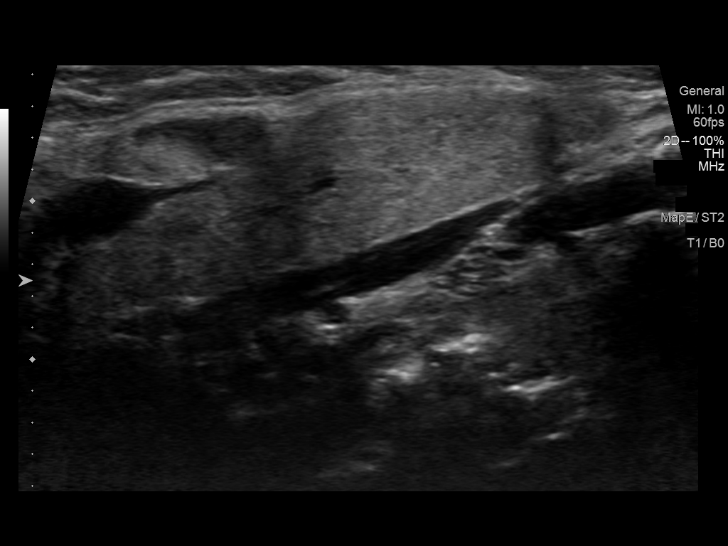
[im 53/58]
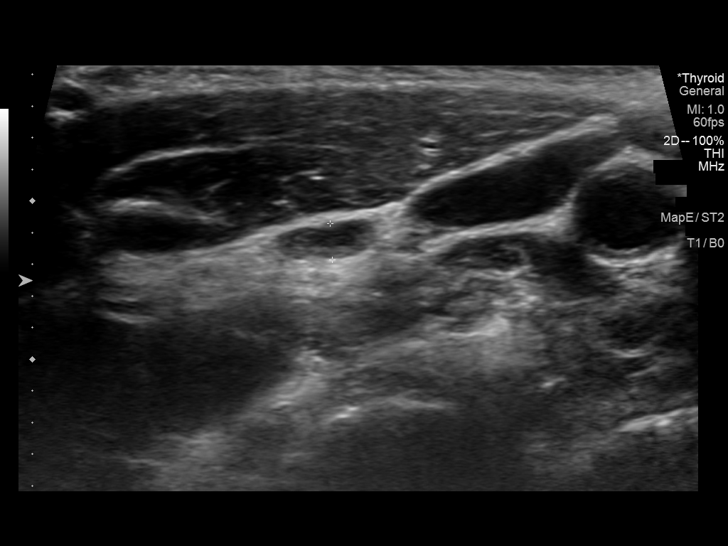
[im 58/58]
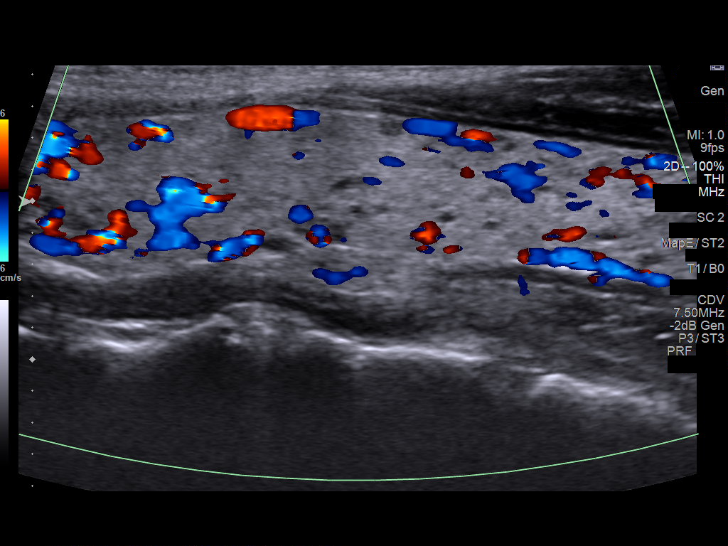

[13 of 25 positions shown; findings below may reference images not displayed]

FINDINGS: Parenchymal Echotexture: Moderately heterogenous - suspected diffuse
glandular hyperemia (images 20, 22 and 38)

Isthmus: Normal in size measures 0.4 cm in diameter

Right lobe: Normal in size measuring 4.8 x 1.2 x 1.2 cm

Left lobe: Normal in size measuring 5.0 x 1.1 x 1.6 cm

_________________________________________________________

Estimated total number of nodules >/= 1 cm: 0

Number of spongiform nodules >/=  2 cm not described below (TR1): 0

Number of mixed cystic and solid nodules >/= 1.5 cm not described
below (TR2): 0

_________________________________________________________

Multiple punctate (sub 5 mm) anechoic cysts are seen scattered
within both the right and left lobes of thyroid.

Note is made of prominent though non pathologically enlarged
bilateral cervical lymph nodes with index left cervical lymph node
measuring 0.7 cm in greatest short axis diameter (image 42) and
index right cervical lymph node measuring 0.6 cm (image 50),
presumably reactive in etiology.
IMPRESSION: 1. Moderately heterogeneous appearing and likely hyperemic but
normal sized thyroid gland without discrete worrisome thyroid nodule
or mass. Findings are nonspecific though could be seen in the
setting of a thyroiditis.
2. Prominent though non pathologically enlarged bilateral cervical
lymph nodes, nonspecific though presumably reactive in etiology.

## 2021-02-24 ENCOUNTER — Other Ambulatory Visit: Payer: Self-pay | Admitting: Nurse Practitioner

## 2021-02-24 ENCOUNTER — Ambulatory Visit: Payer: 59 | Admitting: Nurse Practitioner

## 2021-02-24 ENCOUNTER — Other Ambulatory Visit: Payer: Self-pay

## 2021-02-24 ENCOUNTER — Encounter: Payer: Self-pay | Admitting: Nurse Practitioner

## 2021-02-24 VITALS — BP 118/68 | HR 78 | Temp 98.1°F | Ht 60.4 in | Wt 125.6 lb

## 2021-02-24 DIAGNOSIS — Z7689 Persons encountering health services in other specified circumstances: Secondary | ICD-10-CM | POA: Diagnosis not present

## 2021-02-24 DIAGNOSIS — N946 Dysmenorrhea, unspecified: Secondary | ICD-10-CM | POA: Diagnosis not present

## 2021-02-24 MED ORDER — IBUPROFEN 800 MG PO TABS: 800.0000 mg | ORAL_TABLET | Freq: Three times a day (TID) | ORAL | 2 refills | Status: DC

## 2021-02-24 MED FILL — IBUPROFEN 800 MG TABS: 800 | 10 days supply | Qty: 30 | Fill #0

## 2021-02-24 NOTE — Patient Instructions (Signed)
If you need to call the office you can call [phone] Ext [phone]

## 2021-02-24 NOTE — Progress Notes (Signed)
I,Yamilka Roman [employer] as a Neurosurgeon for [employer], FNP.,have documented all relevant documentation on the behalf of Arnette Felts, FNP,as directed by  Arnette Felts, FNP while in the presence of Arnette Felts, FNP. This visit occurred during the SARS-CoV-2 public health emergency.  Safety protocols were in place, including screening questions prior to the visit, additional usage of staff PPE, and extensive cleaning of exam room while observing appropriate contact time as indicated for disinfecting solutions.  Subjective:     Patient ID: Debra Armstrong , female    DOB: 29-Mar-1971 , 50 y.o.   MRN: [id number]   Chief Complaint  Patient presents with  . Establish Care    Patient presents today to establish primary care.    HPI  Patient presents today to establish primary care. She had been seeing Dr. Jeanice Lim at [facility] who is leaving the practice. The last time she was seen was in August.  She works as a Engineer, civil (consulting) at [employer]. Single. She has one child, [place] lives in [place].   PMH - she had anemia in 2014 with blood transfusion, constipation, hyperthyroid (while in [place] she did take methimazole she took herself off and has not had any problems). Dr. Cherly Hensen did an ultrasound of thyroid last year which was normal.  She is due to see Dr. Cherly Hensen next month. No hysterectomy.   She had an anaphylactic reaction to sulfa drugs. She had swollen lips, she took benadryl and was not effective.   She has not had a menstrual cycle since March 2021. She feels like she may have had some "hot flashes".   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  No recent outbreaks.    Past Medical History:  Diagnosis Date  . Anemia   . Constipation, chronic   . Hyperthyroidism      Family History  Problem Relation Age of Onset  . Diabetes Mother   . Hyperlipidemia Mother   . Hypertension Maternal Grandmother    . Glaucoma Maternal Grandmother   . Heart disease Maternal Grandfather        Valve relacement, Bypass   . Diabetes Paternal Grandmother      Current Outpatient Medications:  .  valACYclovir (VALTREX) 1000 MG tablet, Take 1 tablet (1,000 mg total) by mouth 3 (three) times daily., Disp: 21 tablet, Rfl: 0 .  valACYclovir (VALTREX) 500 MG tablet, Take 1 tablet (500 mg total) by mouth daily., Disp: 30 tablet, Rfl: 3 .  ibuprofen (ADVIL) 800 MG tablet, Take 1 tablet (800 mg total) by mouth 3 (three) times daily., Disp: 30 tablet, Rfl: 2   Allergies  Allergen Reactions  . Sulfa Antibiotics Anaphylaxis     Review of Systems  Constitutional: Negative.   HENT: Negative.   Eyes: Negative.   Respiratory: Negative.   Cardiovascular: Negative.  Negative for chest pain, palpitations and leg swelling.  Gastrointestinal: Negative.   Endocrine: Negative.   Genitourinary: Negative.   Musculoskeletal: Negative.   Skin: Negative.   Neurological: Negative.   Hematological: Negative.   Psychiatric/Behavioral: Negative.      Today's Vitals   02/24/21 1410  BP: 118/68  Pulse: 78  Temp: 98.1 F (36.7 C)  TempSrc: Oral  Weight: 125 lb 9.6 oz (57 kg)  Height: 5' 0.4" (1.534 m)  PainSc: 0-No pain   Body mass index is 24.21 kg/m.   Objective:  Physical  Exam Constitutional:      General: She is not in acute distress.    Appearance: Normal appearance. She is normal weight.  Cardiovascular:     Rate and Rhythm: Normal rate and regular rhythm.     Pulses: Normal pulses.     Heart sounds: Normal heart sounds.  Pulmonary:     Effort: Pulmonary effort is normal. No respiratory distress.     Breath sounds: Normal breath sounds. No wheezing.  Abdominal:     General: Abdomen is flat. Bowel sounds are normal.     Palpations: Abdomen is soft.  Musculoskeletal:        General: Normal range of motion.     Cervical back: Normal range of motion and neck supple.  Skin:    General: Skin is warm  and dry.  Neurological:     General: No focal deficit present.     Mental Status: She is alert and oriented to person, place, and time.  Psychiatric:        Mood and Affect: Mood normal.        Behavior: Behavior normal.        Thought Content: Thought content normal.        Judgment: Judgment normal.         Assessment And Plan:     1. Menstrual cramps  Infrequent but likes to have Ibuprofen on standby - ibuprofen (ADVIL) 800 MG tablet; Take 1 tablet (800 mg total) by mouth 3 (three) times daily.  Dispense: 30 tablet; Refill: 2  2. Establishing care with new doctor, encounter for  Her current PCP is leaving the practice and she came to find a new provider     Patient was given opportunity to ask questions. Patient verbalized understanding of the plan and was able to repeat key elements of the plan. All questions were answered to their satisfaction.  Arnette Felts, FNP   I, Arnette Felts, FNP, have reviewed all documentation for this visit. The documentation on 02/24/21 for the exam, diagnosis, procedures, and orders are all accurate and complete.   THE PATIENT IS ENCOURAGED TO PRACTICE SOCIAL DISTANCING DUE TO THE COVID-19 PANDEMIC.

## 2021-03-21 DIAGNOSIS — N911 Secondary amenorrhea: Secondary | ICD-10-CM | POA: Diagnosis not present

## 2021-03-21 DIAGNOSIS — Z1231 Encounter for screening mammogram for malignant neoplasm of breast: Secondary | ICD-10-CM | POA: Diagnosis not present

## 2021-03-21 DIAGNOSIS — Z01419 Encounter for gynecological examination (general) (routine) without abnormal findings: Secondary | ICD-10-CM | POA: Diagnosis not present

## 2021-04-06 DIAGNOSIS — R87810 Cervical high risk human papillomavirus (HPV) DNA test positive: Secondary | ICD-10-CM | POA: Diagnosis not present

## 2021-04-06 DIAGNOSIS — R87610 Atypical squamous cells of undetermined significance on cytologic smear of cervix (ASC-US): Secondary | ICD-10-CM | POA: Diagnosis not present

## 2021-04-06 DIAGNOSIS — N870 Mild cervical dysplasia: Secondary | ICD-10-CM | POA: Diagnosis not present

## 2021-06-01 DIAGNOSIS — N870 Mild cervical dysplasia: Secondary | ICD-10-CM | POA: Diagnosis not present

## 2021-06-22 DIAGNOSIS — N870 Mild cervical dysplasia: Secondary | ICD-10-CM | POA: Diagnosis not present

## 2021-06-22 DIAGNOSIS — Z9889 Other specified postprocedural states: Secondary | ICD-10-CM | POA: Diagnosis not present

## 2021-07-11 ENCOUNTER — Other Ambulatory Visit (HOSPITAL_COMMUNITY): Payer: Self-pay

## 2021-07-11 MED ORDER — FOLIC ACID 1 MG PO TABS
1.0000 mg | ORAL_TABLET | Freq: Every day | ORAL | 11 refills | Status: DC
  Filled 2021-07-11 – 2021-07-19 (×2): qty 30, 30d supply, fill #0
  Filled 2021-08-15: qty 30, 30d supply, fill #1
  Filled 2021-09-14: qty 30, 30d supply, fill #2
  Filled 2021-10-14 – 2021-10-19 (×2): qty 30, 30d supply, fill #3
  Filled 2021-11-17: qty 30, 30d supply, fill #4
  Filled 2021-12-21: qty 30, 30d supply, fill #5
  Filled 2022-01-23: qty 30, 30d supply, fill #6
  Filled 2022-03-06: qty 30, 30d supply, fill #7
  Filled 2022-04-18: qty 30, 30d supply, fill #8
  Filled 2022-05-23: qty 30, 30d supply, fill #9
  Filled 2022-06-21 – 2022-07-03 (×2): qty 30, 30d supply, fill #10

## 2021-07-19 ENCOUNTER — Other Ambulatory Visit (HOSPITAL_COMMUNITY): Payer: Self-pay

## 2021-07-19 MED FILL — Valacyclovir HCl Tab 500 MG: ORAL | 30 days supply | Qty: 30 | Fill #0 | Status: AC

## 2021-07-20 ENCOUNTER — Encounter: Payer: 59 | Admitting: Family Medicine

## 2021-07-27 ENCOUNTER — Encounter: Payer: 59 | Admitting: Nurse Practitioner

## 2021-07-27 ENCOUNTER — Ambulatory Visit (INDEPENDENT_AMBULATORY_CARE_PROVIDER_SITE_OTHER): Payer: 59 | Admitting: Nurse Practitioner

## 2021-07-27 ENCOUNTER — Encounter: Payer: Self-pay | Admitting: Nurse Practitioner

## 2021-07-27 ENCOUNTER — Other Ambulatory Visit: Payer: Self-pay

## 2021-07-27 VITALS — BP 100/64 | HR 68 | Temp 97.8°F | Ht 60.6 in | Wt 127.2 lb

## 2021-07-27 DIAGNOSIS — Z0000 Encounter for general adult medical examination without abnormal findings: Secondary | ICD-10-CM | POA: Diagnosis not present

## 2021-07-27 DIAGNOSIS — E559 Vitamin D deficiency, unspecified: Secondary | ICD-10-CM

## 2021-07-27 LAB — LIPID PANEL
Chol/HDL Ratio: 1.7 ratio (ref 0.0–4.4)
Cholesterol, Total: 187 mg/dL (ref 100–199)
HDL: 108 mg/dL (ref >39)
LDL Chol Calc (NIH): 71 mg/dL (ref 0–99)
Triglycerides: 39 mg/dL (ref 0–149)
VLDL Cholesterol Cal: 8 mg/dL (ref 5–40)

## 2021-07-27 NOTE — Progress Notes (Signed)
I,Katawbba Wiggins,acting as a Education administrator for [employer], NP.,have documented all relevant documentation on the behalf of [employer], NP,as directed by  Bary Castilla, NP while in the presence of Bary Castilla, NP.  This visit occurred during the SARS-CoV-2 public health emergency.  Safety protocols were in place, including screening questions prior to the visit, additional usage of staff PPE, and extensive cleaning of exam room while observing appropriate contact time as indicated for disinfecting solutions.  Subjective:     Patient ID: Debra Armstrong , female    DOB: [birth year] , 50 y.o.   MRN: [id number]   Chief Complaint  Patient presents with   Annual Exam   HPI  The patient is here to have a physical examination.  The patient is followed by Dr. Garwin Brothers for her GYN Care.  Last pap was April 2022.  Last mammogram was done April 2022 at [facility]. She has no new concerns.  Diet: She is trying to eat a healthy diet. Incorporating a lot of water. She doesn't eat a lot of fried foods.    Exercise: She is trying but does not exercise as much as she should  She does not drink or smoke. She is menopause.   Other Pertinent negatives include no arthralgias, chest pain, chills, congestion, headaches or myalgias.    Past Medical History:  Diagnosis Date   Anemia    Constipation, chronic    Hyperthyroidism      Family History  Problem Relation Age of Onset   Diabetes Mother    Hyperlipidemia Mother    Hypertension Maternal Grandmother    Glaucoma Maternal Grandmother    Heart disease Maternal Grandfather        Valve relacement, Bypass    Diabetes Paternal Grandmother      Current Outpatient Medications:    Docusate Sodium (COLACE PO), Take by mouth. As needed, Disp: , Rfl:    folic acid (FOLVITE) 1 MG tablet, Take 1 tablet (1 mg total) by mouth daily., Disp: 30 tablet, Rfl: 11   ibuprofen (ADVIL) 800 MG tablet, TAKE 1 TABLET BY MOUTH 3 (THREE) TIMES  DAILY., Disp: 30 tablet, Rfl: 2   valACYclovir (VALTREX) 1000 MG tablet, TAKE 1 TABLET (1,000 MG TOTAL) BY MOUTH 3 (THREE) TIMES DAILY., Disp: 21 tablet, Rfl: 0   valACYclovir (VALTREX) 500 MG tablet, TAKE 1 TABLET (500 MG TOTAL) BY MOUTH DAILY., Disp: 30 tablet, Rfl: 3   Allergies  Allergen Reactions   Sulfa Antibiotics Anaphylaxis      The patient states she uses none for birth control. Last LMP was No LMP recorded. Patient is postmenopausal.. Negative for Dysmenorrhea. Patient is post menopause. Negative for: breast discharge, breast lump(s), breast pain and breast self exam. Associated symptoms include abnormal vaginal bleeding. Pertinent negatives include abnormal bleeding (hematology), anxiety, decreased libido, depression, difficulty falling sleep, dyspareunia, history of infertility, nocturia, sexual dysfunction, sleep disturbances, urinary incontinence, urinary urgency, vaginal discharge and vaginal itching. Diet regular.The patient states her exercise level is    . The patient's tobacco use is: none  Social History   Tobacco Use  Smoking Status Never  Smokeless Tobacco Never  . She has been exposed to passive smoke. The patient's alcohol use is:  Social History   Substance and Sexual Activity  Alcohol Use Never  . Additional information: Last pap 2021, next one scheduled for 2024.    Review of Systems  Constitutional: Negative.  Negative for chills.  HENT: Negative.  Negative for congestion, sinus pressure,  sinus pain and sneezing.   Eyes: Negative.  Negative for redness.  Respiratory: Negative.  Negative for choking, shortness of breath and wheezing.   Cardiovascular: Negative.  Negative for chest pain and palpitations.  Gastrointestinal: Negative.  Negative for abdominal distention, constipation and diarrhea.  Endocrine: Negative.  Negative for polydipsia, polyphagia and polyuria.  Genitourinary: Negative.   Musculoskeletal:  Negative for arthralgias and myalgias.  Skin:  Negative.   Allergic/Immunologic: Negative.   Neurological: Negative.  Negative for dizziness and headaches.  Hematological: Negative.   Psychiatric/Behavioral: Negative.      Today's Vitals   07/27/21 0917  BP: 100/64  Pulse: 68  Temp: 97.8 F (36.6 C)  TempSrc: Oral  Weight: 127 lb 3.2 oz (57.7 kg)  Height: 5' 0.6" (1.539 m)   Body mass index is 24.35 kg/m.  Wt Readings from Last 3 Encounters:  07/27/21 127 lb 3.2 oz (57.7 kg)  02/24/21 125 lb 9.6 oz (57 kg)  07/19/20 125 lb (56.7 kg)    BP Readings from Last 3 Encounters:  07/27/21 100/64  02/24/21 118/68  07/19/20 (!) 104/60    Objective:  Physical Exam Vitals and nursing note reviewed.  Constitutional:      Appearance: Normal appearance.  HENT:     Head: Normocephalic and atraumatic.     Right Ear: Tympanic membrane, ear canal and external ear normal.     Left Ear: Tympanic membrane, ear canal and external ear normal.     Nose: Nose normal.     Mouth/Throat:     Mouth: Mucous membranes are moist.     Pharynx: Oropharynx is clear.  Eyes:     Extraocular Movements: Extraocular movements intact.     Conjunctiva/sclera: Conjunctivae normal.     Pupils: Pupils are equal, round, and reactive to light.  Cardiovascular:     Rate and Rhythm: Normal rate and regular rhythm.     Pulses: Normal pulses.     Heart sounds: Normal heart sounds.  Pulmonary:     Effort: Pulmonary effort is normal.     Breath sounds: Normal breath sounds.  Chest:  Breasts:    Tanner Score is 5.     Right: Normal.     Left: Normal.  Abdominal:     General: Abdomen is flat. Bowel sounds are normal.     Palpations: Abdomen is soft.  Genitourinary:    Comments: deferred Musculoskeletal:        General: Normal range of motion.     Cervical back: Normal range of motion and neck supple.  Skin:    General: Skin is warm and dry.  Neurological:     General: No focal deficit present.     Mental Status: She is alert and oriented to person,  place, and time.  Psychiatric:        Mood and Affect: Mood normal.        Behavior: Behavior normal.        Assessment And Plan:     1. Routine general medical examination at a health care facility -Patient is here for their annual physical exam and we discussed any changes to medication and medical history.  -Behavior modification was discussed as well as diet and exercise history  -Patient will continue to exercise regularly and modify their diet.  -Recommendation for yearly physical annuals, immunization and screenings including mammogram and colonoscopy were discussed with the patient.  -Recommended intake of multivitamin, vitamin D and calcium.  -Individualized advise was given to the patient  pertaining to their own health history in regards to diet, exercise, medical condition and referrals.  - Hemoglobin A1c - Hepatitis C antibody - CBC - CMP14+EGFR - Lipid panel  2. Vitamin D deficiency -Will check and supplement if needed. Advised patient to spend atleast 15 min. Daily in sunlight.  - VITAMIN D 25 Hydroxy (Vit-D Deficiency, Fractures)  The patient was encouraged to call or send a message through [place] for any questions or concerns.   Follow up: if symptoms persist or do not get better.   Staying healthy and adopting a healthy lifestyle for your overall health is important. You should eat 7 or more servings of fruits and vegetables per day. You should drink plenty of water to keep yourself hydrated and your kidneys healthy. This includes about 65-80+ fluid ounces of water. Limit your intake of animal fats especially for elevated cholesterol. Avoid highly processed food and limit your salt intake if you have hypertension. Avoid foods high in saturated/Trans fats. Along with a healthy diet it is also very important to maintain time for yourself to maintain a healthy mental health with low stress levels. You should get atleast 150 min of moderate intensity exercise weekly for a  healthy heart. Along with eating right and exercising, aim for at least 7-9 hours of sleep daily.  Eat more whole grains which includes barley, wheat berries, oats, brown rice and whole wheat pasta. Use healthy plant oils which include olive, soy, corn, sunflower and peanut. Limit your caffeine and sugary drinks. Limit your intake of fast foods. Limit milk and dairy products to one or two daily servings.   Patient was given opportunity to ask questions. Patient verbalized understanding of the plan and was able to repeat key elements of the plan. All questions were answered to their satisfaction.  Raman Marguita Venning, DNP   I, Raman Joseline Mccampbell have reviewed all documentation for this visit. The documentation on 07/27/21 for the exam, diagnosis, procedures, and orders are all accurate and complete.    agnosis, procedures, and orders are all accurate and complete.  THE PATIENT IS ENCOURAGED TO PRACTICE SOCIAL DISTANCING DUE TO THE COVID-19 PANDEMIC.

## 2021-07-27 NOTE — Patient Instructions (Signed)
Health Maintenance, Female Adopting a healthy lifestyle and getting preventive care are important in promoting health and wellness. Ask your health care provider about: The right schedule for you to have regular tests and exams. Things you can do on your own to prevent diseases and keep yourself healthy. What should I know about diet, weight, and exercise? Eat a healthy diet  Eat a diet that includes plenty of vegetables, fruits, low-fat dairy products, and lean protein. Do not eat a lot of foods that are high in solid fats, added sugars, or sodium.  Maintain a healthy weight Body mass index (BMI) is used to identify weight problems. It estimates body fat based on height and weight. Your health care provider can help determineyour BMI and help you achieve or maintain a healthy weight. Get regular exercise Get regular exercise. This is one of the most important things you can do for your health. Most adults should: Exercise for at least 150 minutes each week. The exercise should increase your heart rate and make you sweat (moderate-intensity exercise). Do strengthening exercises at least twice a week. This is in addition to the moderate-intensity exercise. Spend less time sitting. Even light physical activity can be beneficial. Watch cholesterol and blood lipids Have your blood tested for lipids and cholesterol at 50 years of age, then havethis test every 5 years. Have your cholesterol levels checked more often if: Your lipid or cholesterol levels are high. You are older than 50 years of age. You are at high risk for heart disease. What should I know about cancer screening? Depending on your health history and family history, you may need to have cancer screening at various ages. This may include screening for: Breast cancer. Cervical cancer. Colorectal cancer. Skin cancer. Lung cancer. What should I know about heart disease, diabetes, and high blood pressure? Blood pressure and heart  disease High blood pressure causes heart disease and increases the risk of stroke. This is more likely to develop in people who have high blood pressure readings, are of African descent, or are overweight. Have your blood pressure checked: Every 3-5 years if you are 18-39 years of age. Every year if you are 40 years old or older. Diabetes Have regular diabetes screenings. This checks your fasting blood sugar level. Have the screening done: Once every three years after age 40 if you are at a normal weight and have a low risk for diabetes. More often and at a younger age if you are overweight or have a high risk for diabetes. What should I know about preventing infection? Hepatitis B If you have a higher risk for hepatitis B, you should be screened for this virus. Talk with your health care provider to find out if you are at risk forhepatitis B infection. Hepatitis C Testing is recommended for: Everyone born from 1945 through 1965. Anyone with known risk factors for hepatitis C. Sexually transmitted infections (STIs) Get screened for STIs, including gonorrhea and chlamydia, if: You are sexually active and are younger than 50 years of age. You are older than 50 years of age and your health care provider tells you that you are at risk for this type of infection. Your sexual activity has changed since you were last screened, and you are at increased risk for chlamydia or gonorrhea. Ask your health care provider if you are at risk. Ask your health care provider about whether you are at high risk for HIV. Your health care provider may recommend a prescription medicine to help   prevent HIV infection. If you choose to take medicine to prevent HIV, you should first get tested for HIV. You should then be tested every 3 months for as long as you are taking the medicine. Pregnancy If you are about to stop having your period (premenopausal) and you may become pregnant, seek counseling before you get  pregnant. Take 400 to 800 micrograms (mcg) of folic acid every day if you become pregnant. Ask for birth control (contraception) if you want to prevent pregnancy. Osteoporosis and menopause Osteoporosis is a disease in which the bones lose minerals and strength with aging. This can result in bone fractures. If you are 65 years old or older, or if you are at risk for osteoporosis and fractures, ask your health care provider if you should: Be screened for bone loss. Take a calcium or vitamin D supplement to lower your risk of fractures. Be given hormone replacement therapy (HRT) to treat symptoms of menopause. Follow these instructions at home: Lifestyle Do not use any products that contain nicotine or tobacco, such as cigarettes, e-cigarettes, and chewing tobacco. If you need help quitting, ask your health care provider. Do not use street drugs. Do not share needles. Ask your health care provider for help if you need support or information about quitting drugs. Alcohol use Do not drink alcohol if: Your health care provider tells you not to drink. You are pregnant, may be pregnant, or are planning to become pregnant. If you drink alcohol: Limit how much you use to 0-1 drink a day. Limit intake if you are breastfeeding. Be aware of how much alcohol is in your drink. In the [place] one drink equals one 12 oz bottle of beer (355 mL), one 5 oz glass of wine (148 mL), or one 1 oz glass of hard liquor (44 mL). General instructions Schedule regular health, dental, and eye exams. Stay current with your vaccines. Tell your health care provider if: You often feel depressed. You have ever been abused or do not feel safe at home. Summary Adopting a healthy lifestyle and getting preventive care are important in promoting health and wellness. Follow your health care provider's instructions about healthy diet, exercising, and getting tested or screened for diseases. Follow your health care provider's  instructions on monitoring your cholesterol and blood pressure. This information is not intended to replace advice given to you by your health care provider. Make sure you discuss any questions you have with your healthcare provider. Document Revised: 11/27/2018 Document Reviewed: 11/27/2018 Elsevier Patient Education  2022 [employer]  

## 2021-07-28 LAB — VITAMIN D 25 HYDROXY (VIT D DEFICIENCY, FRACTURES): Vit D, 25-Hydroxy: 10.8 ng/mL — ABNORMAL LOW (ref 30.0–100.0)

## 2021-07-28 LAB — CMP14+EGFR
ALT: 8 IU/L (ref 0–32)
AST: 17 IU/L (ref 0–40)
Albumin/Globulin Ratio: 1.9 (ref 1.2–2.2)
Albumin: 4.7 g/dL (ref 3.8–4.8)
Alkaline Phosphatase: 100 IU/L (ref 44–121)
BUN/Creatinine Ratio: 10 (ref 9–23)
BUN: 8 mg/dL (ref 6–24)
Bilirubin Total: 0.6 mg/dL (ref 0.0–1.2)
CO2: 24 mmol/L (ref 20–29)
Calcium: 9.8 mg/dL (ref 8.7–10.2)
Chloride: 103 mmol/L (ref 96–106)
Creatinine, Ser: 0.8 mg/dL (ref 0.57–1.00)
Globulin, Total: 2.5 g/dL (ref 1.5–4.5)
Glucose: 95 mg/dL (ref 65–99)
Potassium: 4.3 mmol/L (ref 3.5–5.2)
Sodium: 142 mmol/L (ref 134–144)
Total Protein: 7.2 g/dL (ref 6.0–8.5)
eGFR: 90 mL/min/{1.73_m2} (ref >59)

## 2021-07-28 LAB — CBC
Hematocrit: 39.5 % (ref 34.0–46.6)
Hemoglobin: 13.3 g/dL (ref 11.1–15.9)
MCH: 31.9 pg (ref 26.6–33.0)
MCHC: 33.7 g/dL (ref 31.5–35.7)
MCV: 95 fL (ref 79–97)
Platelets: 302 10*3/uL (ref 150–450)
RBC: 4.17 x10E6/uL (ref 3.77–5.28)
RDW: 12.3 % (ref 11.7–15.4)
WBC: 4.2 10*3/uL (ref 3.4–10.8)

## 2021-07-28 LAB — HEMOGLOBIN A1C
Est. average glucose Bld gHb Est-mCnc: 117 mg/dL
Hgb A1c MFr Bld: 5.7 % — ABNORMAL HIGH (ref 4.8–5.6)

## 2021-07-28 LAB — HEPATITIS C ANTIBODY: Hep C Virus Ab: <0.1 s/co ratio (ref 0.0–0.9)

## 2021-08-01 ENCOUNTER — Other Ambulatory Visit (HOSPITAL_COMMUNITY): Payer: Self-pay

## 2021-08-01 ENCOUNTER — Other Ambulatory Visit: Payer: Self-pay | Admitting: Nurse Practitioner

## 2021-08-01 DIAGNOSIS — E559 Vitamin D deficiency, unspecified: Secondary | ICD-10-CM

## 2021-08-01 MED ORDER — VITAMIN D (ERGOCALCIFEROL) 1.25 MG (50000 UNIT) PO CAPS
ORAL_CAPSULE | ORAL | 0 refills | Status: DC
  Filled 2021-08-01: qty 24, 84d supply, fill #0

## 2021-08-15 ENCOUNTER — Other Ambulatory Visit (HOSPITAL_COMMUNITY): Payer: Self-pay

## 2021-08-25 DIAGNOSIS — H524 Presbyopia: Secondary | ICD-10-CM | POA: Diagnosis not present

## 2021-08-25 DIAGNOSIS — H5203 Hypermetropia, bilateral: Secondary | ICD-10-CM | POA: Diagnosis not present

## 2021-08-25 DIAGNOSIS — H40213 Acute angle-closure glaucoma, bilateral: Secondary | ICD-10-CM | POA: Diagnosis not present

## 2021-08-25 DIAGNOSIS — H52223 Regular astigmatism, bilateral: Secondary | ICD-10-CM | POA: Diagnosis not present

## 2021-09-14 ENCOUNTER — Other Ambulatory Visit (HOSPITAL_COMMUNITY): Payer: Self-pay

## 2021-10-14 ENCOUNTER — Other Ambulatory Visit: Payer: Self-pay

## 2021-10-14 MED FILL — Valacyclovir HCl Tab 500 MG: ORAL | 30 days supply | Qty: 30 | Fill #1 | Status: CN

## 2021-10-19 ENCOUNTER — Other Ambulatory Visit (HOSPITAL_COMMUNITY): Payer: Self-pay

## 2021-10-19 MED FILL — Valacyclovir HCl Tab 500 MG: ORAL | 30 days supply | Qty: 30 | Fill #1 | Status: AC

## 2021-11-02 DIAGNOSIS — N870 Mild cervical dysplasia: Secondary | ICD-10-CM | POA: Diagnosis not present

## 2021-11-17 ENCOUNTER — Other Ambulatory Visit (HOSPITAL_COMMUNITY): Payer: Self-pay

## 2021-11-17 MED FILL — Ibuprofen Tab 800 MG: ORAL | 10 days supply | Qty: 30 | Fill #0 | Status: AC

## 2021-12-22 ENCOUNTER — Other Ambulatory Visit (HOSPITAL_COMMUNITY): Payer: Self-pay

## 2021-12-27 MED FILL — Valacyclovir HCl Tab 500 MG: ORAL | 30 days supply | Qty: 30 | Fill #2 | Status: AC

## 2021-12-28 ENCOUNTER — Other Ambulatory Visit (HOSPITAL_COMMUNITY): Payer: Self-pay

## 2022-01-23 ENCOUNTER — Other Ambulatory Visit (HOSPITAL_COMMUNITY): Payer: Self-pay

## 2022-01-23 ENCOUNTER — Other Ambulatory Visit: Payer: Self-pay

## 2022-01-25 ENCOUNTER — Other Ambulatory Visit (HOSPITAL_COMMUNITY): Payer: Self-pay

## 2022-01-25 ENCOUNTER — Other Ambulatory Visit: Payer: Self-pay

## 2022-01-26 ENCOUNTER — Other Ambulatory Visit (HOSPITAL_COMMUNITY): Payer: Self-pay

## 2022-01-27 ENCOUNTER — Other Ambulatory Visit (HOSPITAL_COMMUNITY): Payer: Self-pay

## 2022-03-06 ENCOUNTER — Other Ambulatory Visit (HOSPITAL_COMMUNITY): Payer: Self-pay

## 2022-03-27 DIAGNOSIS — Z1231 Encounter for screening mammogram for malignant neoplasm of breast: Secondary | ICD-10-CM | POA: Diagnosis not present

## 2022-03-27 DIAGNOSIS — Z780 Asymptomatic menopausal state: Secondary | ICD-10-CM | POA: Diagnosis not present

## 2022-03-27 DIAGNOSIS — R87610 Atypical squamous cells of undetermined significance on cytologic smear of cervix (ASC-US): Secondary | ICD-10-CM | POA: Diagnosis not present

## 2022-03-27 DIAGNOSIS — N870 Mild cervical dysplasia: Secondary | ICD-10-CM | POA: Diagnosis not present

## 2022-03-27 DIAGNOSIS — Z01419 Encounter for gynecological examination (general) (routine) without abnormal findings: Secondary | ICD-10-CM | POA: Diagnosis not present

## 2022-03-27 DIAGNOSIS — R87810 Cervical high risk human papillomavirus (HPV) DNA test positive: Secondary | ICD-10-CM | POA: Diagnosis not present

## 2022-04-18 ENCOUNTER — Other Ambulatory Visit (HOSPITAL_COMMUNITY): Payer: Self-pay

## 2022-05-23 ENCOUNTER — Other Ambulatory Visit (HOSPITAL_COMMUNITY): Payer: Self-pay

## 2022-06-21 ENCOUNTER — Other Ambulatory Visit (HOSPITAL_COMMUNITY): Payer: Self-pay

## 2022-06-26 ENCOUNTER — Encounter (HOSPITAL_COMMUNITY): Payer: Self-pay | Admitting: Pharmacist

## 2022-06-26 ENCOUNTER — Other Ambulatory Visit (HOSPITAL_COMMUNITY): Payer: Self-pay

## 2022-06-27 ENCOUNTER — Other Ambulatory Visit (HOSPITAL_COMMUNITY): Payer: Self-pay

## 2022-06-30 ENCOUNTER — Other Ambulatory Visit (HOSPITAL_COMMUNITY): Payer: Self-pay

## 2022-07-01 ENCOUNTER — Other Ambulatory Visit (HOSPITAL_COMMUNITY): Payer: Self-pay

## 2022-07-03 ENCOUNTER — Other Ambulatory Visit (HOSPITAL_COMMUNITY): Payer: Self-pay

## 2022-07-31 ENCOUNTER — Encounter: Payer: Self-pay | Admitting: Nurse Practitioner

## 2022-07-31 ENCOUNTER — Ambulatory Visit (INDEPENDENT_AMBULATORY_CARE_PROVIDER_SITE_OTHER): Payer: 59 | Admitting: Nurse Practitioner

## 2022-07-31 VITALS — BP 108/62 | HR 77 | Temp 98.1°F | Ht 60.6 in | Wt 127.0 lb

## 2022-07-31 DIAGNOSIS — Z1159 Encounter for screening for other viral diseases: Secondary | ICD-10-CM | POA: Diagnosis not present

## 2022-07-31 DIAGNOSIS — E559 Vitamin D deficiency, unspecified: Secondary | ICD-10-CM

## 2022-07-31 DIAGNOSIS — Z0000 Encounter for general adult medical examination without abnormal findings: Secondary | ICD-10-CM | POA: Diagnosis not present

## 2022-07-31 DIAGNOSIS — K5900 Constipation, unspecified: Secondary | ICD-10-CM

## 2022-07-31 DIAGNOSIS — Z23 Encounter for immunization: Secondary | ICD-10-CM

## 2022-07-31 NOTE — Patient Instructions (Signed)

## 2022-07-31 NOTE — Progress Notes (Signed)
I,Tianna Badgett,acting as a Education administrator for [employer], FNP.,have documented all relevant documentation on the behalf of Minette Brine, FNP,as directed by  Minette Brine, FNP while in the presence of Minette Brine, [place].  Subjective:     Patient ID: Debra Armstrong , female    DOB: [birth year] , 51 y.o.   MRN: [id number]   Chief Complaint  Patient presents with   Annual Exam    HPI  The patient is here to have a physical examination.  The patient is followed by Dr. Garwin Brothers for her GYN Care. She is planning to go to [place] in February.   She is taking over the counter vitamin d.   Other Pertinent negatives include no arthralgias, chest pain, chills, congestion, headaches or myalgias.     Past Medical History:  Diagnosis Date   Anemia    Constipation, chronic    Hyperthyroidism      Family History  Problem Relation Age of Onset   Diabetes Mother    Hyperlipidemia Mother    Hypertension Maternal Grandmother    Glaucoma Maternal Grandmother    Heart disease Maternal Grandfather        Valve relacement, Bypass    Diabetes Paternal Grandmother      Current Outpatient Medications:    cholecalciferol (VITAMIN D3) 25 MCG (1000 UNIT) tablet, Take 1,000 Units by mouth daily., Disp: , Rfl:    Docusate Sodium (COLACE PO), Take by mouth. As needed, Disp: , Rfl:    folic acid (FOLVITE) 1 MG tablet, Take 1 tablet (1 mg total) by mouth daily., Disp: 30 tablet, Rfl: 11   ibuprofen (ADVIL) 800 MG tablet, Take 1 tablet (800 mg total) by mouth 3 (three) times daily., Disp: 30 tablet, Rfl: 2   Allergies  Allergen Reactions   Sulfa Antibiotics Anaphylaxis      The patient states she is post menopausal status for birth control. Patient's last menstrual period was 10/12/2019 (approximate).. Negative for Dysmenorrhea and Negative for Menorrhagia. Negative for: breast discharge, breast lump(s), breast pain and breast self exam. Associated symptoms include abnormal vaginal bleeding. Pertinent  negatives include abnormal bleeding (hematology), anxiety, decreased libido, depression, difficulty falling sleep, dyspareunia, history of infertility, nocturia, sexual dysfunction, sleep disturbances, urinary incontinence, urinary urgency, vaginal discharge and vaginal itching. Diet regular.  The patient states her exercise level is none, no barriers.  The patient's tobacco use is:  Social History   Tobacco Use  Smoking Status Never  Smokeless Tobacco Never  . She has been exposed to passive smoke. The patient's alcohol use is:  Social History   Substance and Sexual Activity  Alcohol Use Never   Additional information: Last pap 2023 per patient, next one scheduled for 2026.    Review of Systems  Constitutional: Negative.  Negative for chills.  HENT: Negative.  Negative for congestion.   Eyes: Negative.   Respiratory: Negative.    Cardiovascular: Negative.  Negative for chest pain.  Gastrointestinal: Negative.   Endocrine: Negative.   Genitourinary: Negative.   Musculoskeletal: Negative.  Negative for arthralgias and myalgias.  Skin: Negative.   Allergic/Immunologic: Negative.   Neurological: Negative.  Negative for headaches.  Hematological: Negative.   Psychiatric/Behavioral: Negative.       Today's Vitals   07/31/22 1144  BP: 108/62  Pulse: 77  Temp: 98.1 F (36.7 C)  TempSrc: Oral  Weight: 127 lb (57.6 kg)  Height: 5' 0.6" (1.539 m)   Body mass index is 24.31 kg/m.  Wt Readings from Last 3  Encounters:  07/31/22 127 lb (57.6 kg)  07/27/21 127 lb 3.2 oz (57.7 kg)  02/24/21 125 lb 9.6 oz (57 kg)    Objective:  Physical Exam Constitutional:      General: She is not in acute distress.    Appearance: Normal appearance. She is well-developed. She is obese.  HENT:     Head: Normocephalic and atraumatic.     Right Ear: Hearing, tympanic membrane, ear canal and external ear normal. There is no impacted cerumen.     Left Ear: Hearing, tympanic membrane, ear canal  and external ear normal. There is no impacted cerumen.     Nose:     Comments: Deferred - masked    Mouth/Throat:     Comments: Deferred - masked Eyes:     General: Lids are normal.     Extraocular Movements: Extraocular movements intact.     Conjunctiva/sclera: Conjunctivae normal.     Pupils: Pupils are equal, round, and reactive to light.     Funduscopic exam:    Right eye: No papilledema.        Left eye: No papilledema.  Neck:     Thyroid: No thyroid mass.     Vascular: No carotid bruit.  Cardiovascular:     Rate and Rhythm: Normal rate and regular rhythm.     Pulses: Normal pulses.     Heart sounds: Normal heart sounds. No murmur heard. Pulmonary:     Effort: Pulmonary effort is normal. No respiratory distress.     Breath sounds: Normal breath sounds. No wheezing.  Chest:     Chest wall: No mass.  Breasts:    Tanner Score is 5.     Right: Normal. No mass or tenderness.     Left: Normal. No mass or tenderness.  Abdominal:     General: Abdomen is flat. Bowel sounds are normal. There is no distension.     Palpations: Abdomen is soft.     Tenderness: There is no abdominal tenderness.  Genitourinary:    Rectum: Guaiac result negative.  Musculoskeletal:        General: No swelling. Normal range of motion.     Cervical back: Full passive range of motion without pain, normal range of motion and neck supple.     Right lower leg: No edema.     Left lower leg: No edema.  Lymphadenopathy:     Upper Body:     Right upper body: No supraclavicular, axillary or pectoral adenopathy.     Left upper body: No supraclavicular, axillary or pectoral adenopathy.  Skin:    General: Skin is warm and dry.     Capillary Refill: Capillary refill takes less than 2 seconds.  Neurological:     General: No focal deficit present.     Mental Status: She is alert and oriented to person, place, and time.     Cranial Nerves: No cranial nerve deficit.     Sensory: No sensory deficit.     Motor: No  weakness.  Psychiatric:        Mood and Affect: Mood normal.        Behavior: Behavior normal.        Thought Content: Thought content normal.        Judgment: Judgment normal.         Assessment And Plan:     1. Routine general medical examination at a health care facility Behavior modifications discussed and diet history reviewed.   Pt will continue  to exercise regularly and modify diet with low GI, plant based foods and decrease intake of processed foods.  Recommend intake of daily multivitamin, Vitamin D, and calcium.  Recommend mammogram and colonoscopy for preventive screenings, as well as recommend immunizations that include influenza, TDAP, and Shingles  - CBC - Hemoglobin A1c - CMP14+EGFR - Lipid panel  2. Encounter for hepatitis C screening test for low risk patient Will check Hepatitis C screening due to recent recommendations to screen all adults 18 years and older  3. Vitamin D deficiency Will check vitamin D level and supplement as needed.    Also encouraged to spend 15 minutes in the sun daily.  - VITAMIN D 25 Hydroxy (Vit-D Deficiency, Fractures)  4. Constipation, unspecified constipation type Comments: Encouraged to take a stool softner and increase fiber intake. Ensure drinking at least 64 oz water a day   Patient was given opportunity to ask questions. Patient verbalized understanding of the plan and was able to repeat key elements of the plan. All questions were answered to their satisfaction.   Minette Brine, FNP   I, Minette Brine, FNP, have reviewed all documentation for this visit. The documentation on 07/31/22 for the exam, diagnosis, procedures, and orders are all accurate and complete.   THE PATIENT IS ENCOURAGED TO PRACTICE SOCIAL DISTANCING DUE TO THE COVID-19 PANDEMIC.

## 2022-08-01 LAB — LIPID PANEL
Chol/HDL Ratio: 1.9 ratio (ref 0.0–4.4)
Cholesterol, Total: 218 mg/dL — ABNORMAL HIGH (ref 100–199)
HDL: 112 mg/dL (ref >39)
LDL Chol Calc (NIH): 99 mg/dL (ref 0–99)
Triglycerides: 38 mg/dL (ref 0–149)
VLDL Cholesterol Cal: 7 mg/dL (ref 5–40)

## 2022-08-01 LAB — CMP14+EGFR
ALT: 10 IU/L (ref 0–32)
AST: 15 IU/L (ref 0–40)
Albumin/Globulin Ratio: 2.2 (ref 1.2–2.2)
Albumin: 5.3 g/dL — ABNORMAL HIGH (ref 3.9–4.9)
Alkaline Phosphatase: 107 IU/L (ref 44–121)
BUN/Creatinine Ratio: 14 (ref 9–23)
BUN: 10 mg/dL (ref 6–24)
Bilirubin Total: 0.6 mg/dL (ref 0.0–1.2)
CO2: 25 mmol/L (ref 20–29)
Calcium: 10.1 mg/dL (ref 8.7–10.2)
Chloride: 103 mmol/L (ref 96–106)
Creatinine, Ser: 0.74 mg/dL (ref 0.57–1.00)
Globulin, Total: 2.4 g/dL (ref 1.5–4.5)
Glucose: 81 mg/dL (ref 70–99)
Potassium: 4.2 mmol/L (ref 3.5–5.2)
Sodium: 142 mmol/L (ref 134–144)
Total Protein: 7.7 g/dL (ref 6.0–8.5)
eGFR: 99 mL/min/{1.73_m2} (ref >59)

## 2022-08-01 LAB — CBC
Hematocrit: 42.4 % (ref 34.0–46.6)
Hemoglobin: 13.9 g/dL (ref 11.1–15.9)
MCH: 30.6 pg (ref 26.6–33.0)
MCHC: 32.8 g/dL (ref 31.5–35.7)
MCV: 93 fL (ref 79–97)
Platelets: 312 10*3/uL (ref 150–450)
RBC: 4.54 x10E6/uL (ref 3.77–5.28)
RDW: 11.5 % — ABNORMAL LOW (ref 11.7–15.4)
WBC: 4.7 10*3/uL (ref 3.4–10.8)

## 2022-08-01 LAB — VITAMIN D 25 HYDROXY (VIT D DEFICIENCY, FRACTURES): Vit D, 25-Hydroxy: 35.8 ng/mL (ref 30.0–100.0)

## 2022-08-01 LAB — HEMOGLOBIN A1C
Est. average glucose Bld gHb Est-mCnc: 111 mg/dL
Hgb A1c MFr Bld: 5.5 % (ref 4.8–5.6)

## 2022-08-07 ENCOUNTER — Encounter: Payer: 59 | Admitting: Nurse Practitioner

## 2022-08-09 ENCOUNTER — Encounter: Payer: 59 | Admitting: Nurse Practitioner

## 2022-08-09 NOTE — Addendum Note (Signed)
Addended by: Nelda Bucks on: 08/09/2022 04:24 PM   Modules accepted: Orders

## 2022-08-28 ENCOUNTER — Other Ambulatory Visit (HOSPITAL_COMMUNITY): Payer: Self-pay

## 2022-08-28 MED ORDER — ATOVAQUONE-PROGUANIL HCL 250-100 MG PO TABS
1.0000 | ORAL_TABLET | Freq: Every day | ORAL | 0 refills | Status: DC
  Filled 2022-08-28: qty 24, 24d supply, fill #0

## 2022-08-28 MED ORDER — AZITHROMYCIN 500 MG PO TABS
500.0000 mg | ORAL_TABLET | ORAL | 0 refills | Status: DC
  Filled 2022-08-28: qty 4, 3d supply, fill #0

## 2022-08-29 ENCOUNTER — Other Ambulatory Visit (HOSPITAL_COMMUNITY): Payer: Self-pay

## 2022-10-17 DIAGNOSIS — H5203 Hypermetropia, bilateral: Secondary | ICD-10-CM | POA: Diagnosis not present

## 2022-10-17 DIAGNOSIS — H52223 Regular astigmatism, bilateral: Secondary | ICD-10-CM | POA: Diagnosis not present

## 2022-10-17 DIAGNOSIS — H524 Presbyopia: Secondary | ICD-10-CM | POA: Diagnosis not present

## 2022-10-17 DIAGNOSIS — H40033 Anatomical narrow angle, bilateral: Secondary | ICD-10-CM | POA: Diagnosis not present

## 2023-01-01 ENCOUNTER — Other Ambulatory Visit (HOSPITAL_COMMUNITY): Payer: Self-pay

## 2023-01-01 MED ORDER — SCOPOLAMINE 1 MG/3DAYS TD PT72
1.0000 | MEDICATED_PATCH | TRANSDERMAL | 0 refills | Status: DC
Start: 2023-01-01 — End: 2023-06-26
  Filled 2023-01-01: qty 2, 6d supply, fill #0

## 2023-01-02 ENCOUNTER — Ambulatory Visit (INDEPENDENT_AMBULATORY_CARE_PROVIDER_SITE_OTHER): Payer: 59

## 2023-01-02 ENCOUNTER — Other Ambulatory Visit (HOSPITAL_COMMUNITY): Payer: Self-pay

## 2023-01-02 VITALS — BP 112/68 | HR 79 | Temp 98.3°F

## 2023-01-02 DIAGNOSIS — Z23 Encounter for immunization: Secondary | ICD-10-CM

## 2023-01-02 MED ORDER — ALPRAZOLAM 0.25 MG PO TABS
0.2500 mg | ORAL_TABLET | Freq: Three times a day (TID) | ORAL | 0 refills | Status: DC | PRN
  Filled 2023-01-02: qty 10, 4d supply, fill #0

## 2023-01-02 NOTE — Progress Notes (Signed)
Patient presents today for 2nd shingles vaccine.  ?

## 2023-01-02 NOTE — Patient Instructions (Signed)
Zoster Vaccine Injection What is this medication? ZOSTER VACCINE (ZOS ter vak SEEN) reduces the risk of herpes zoster (shingles). It does not treat shingles. It is still possible to get shingles after receiving the vaccine, but the symptoms may be less severe or not last as long. It works by helping your immune system learn how to fight off a future infection. This medicine may be used for other purposes; ask your health care provider or pharmacist if you have questions. COMMON BRAND NAME(S): SHINGRIX What should I tell my care team before I take this medication? They need to know if you have any of these conditions: Cancer Immune system problems An unusual or allergic reaction to Zoster vaccine, other medications, foods, dyes, or preservatives Pregnant or trying to get pregnant Breastfeeding How should I use this medication? This vaccine is injected into a muscle. It is given by your care team. This vaccine requires 2 doses to get the full benefit. Set a reminder for when your next dose is due. A copy of Vaccine Information Statements will be given before each vaccination. Be sure to read this information carefully each time. This sheet may change often. Talk to your care team about the use of this vaccine in children. This vaccine is not approved for use in children. Overdosage: If you think you have taken too much of this medicine contact a poison control center or emergency room at once. NOTE: This medicine is only for you. Do not share this medicine with others. What if I miss a dose? Keep appointments for follow-up (booster) doses. It is important not to miss your dose. Call your care team if you are unable to keep an appointment. What may interact with this medication? Medications that suppress your immune system Medications to treat cancer Steroid medications, such as prednisone or cortisone This list may not describe all possible interactions. Give your health care provider a list  of all the medicines, herbs, non-prescription drugs, or dietary supplements you use. Also tell them if you smoke, drink alcohol, or use illegal drugs. Some items may interact with your medicine. What should I watch for while using this medication? Visit your care team regularly. This vaccine, like all vaccines, may not fully protect everyone. What side effects may I notice from receiving this medication? Side effects that you should report to your care team as soon as possible: Allergic reactions--skin rash, itching, hives, swelling of the face, lips, tongue, or throat Side effects that usually do not require medical attention (report these to your care team if they continue or are bothersome): Chills Fatigue Feeling faint or lightheaded Fever Headache Muscle pain Pain, redness, or irritation at injection site This list may not describe all possible side effects. Call your doctor for medical advice about side effects. You may report side effects to FDA at 1-800-FDA-1088. Where should I keep my medication? This vaccine is only given by your care team. It will not be stored at home. NOTE: This sheet is a summary. It may not cover all possible information. If you have questions about this medicine, talk to your doctor, pharmacist, or health care provider.  2023 Elsevier/Gold Standard (2022-05-17 00:00:00)  

## 2023-01-12 ENCOUNTER — Other Ambulatory Visit (HOSPITAL_COMMUNITY): Payer: Self-pay

## 2023-01-12 DIAGNOSIS — R350 Frequency of micturition: Secondary | ICD-10-CM | POA: Diagnosis not present

## 2023-01-12 MED ORDER — NITROFURANTOIN MONOHYD MACRO 100 MG PO CAPS
ORAL_CAPSULE | ORAL | 0 refills | Status: DC
Start: 2023-01-12 — End: 2023-06-26
  Filled 2023-01-12: qty 14, 7d supply, fill #0

## 2023-02-13 ENCOUNTER — Ambulatory Visit: Payer: 59 | Admitting: Nurse Practitioner

## 2023-04-02 DIAGNOSIS — Z1231 Encounter for screening mammogram for malignant neoplasm of breast: Secondary | ICD-10-CM | POA: Diagnosis not present

## 2023-04-02 DIAGNOSIS — Z01419 Encounter for gynecological examination (general) (routine) without abnormal findings: Secondary | ICD-10-CM | POA: Diagnosis not present

## 2023-04-02 DIAGNOSIS — N870 Mild cervical dysplasia: Secondary | ICD-10-CM | POA: Diagnosis not present

## 2023-04-02 DIAGNOSIS — D251 Intramural leiomyoma of uterus: Secondary | ICD-10-CM | POA: Diagnosis not present

## 2023-04-02 DIAGNOSIS — N898 Other specified noninflammatory disorders of vagina: Secondary | ICD-10-CM | POA: Diagnosis not present

## 2023-04-02 LAB — HM MAMMOGRAPHY

## 2023-04-06 ENCOUNTER — Other Ambulatory Visit (HOSPITAL_COMMUNITY): Payer: Self-pay

## 2023-04-06 MED ORDER — METRONIDAZOLE 500 MG PO TABS
500.0000 mg | ORAL_TABLET | Freq: Two times a day (BID) | ORAL | 0 refills | Status: DC
  Filled 2023-04-06: qty 14, 7d supply, fill #0

## 2023-05-04 ENCOUNTER — Other Ambulatory Visit (HOSPITAL_COMMUNITY): Payer: Self-pay

## 2023-05-04 MED ORDER — ESTRADIOL 0.1 MG/GM VA CREA
0.5000 g | TOPICAL_CREAM | Freq: Every day | VAGINAL | 0 refills | Status: DC
Start: 2023-05-04 — End: 2023-06-26
  Filled 2023-05-04: qty 42.5, 14d supply, fill #0

## 2023-05-15 DIAGNOSIS — R87610 Atypical squamous cells of undetermined significance on cytologic smear of cervix (ASC-US): Secondary | ICD-10-CM | POA: Diagnosis not present

## 2023-05-15 DIAGNOSIS — R87810 Cervical high risk human papillomavirus (HPV) DNA test positive: Secondary | ICD-10-CM | POA: Diagnosis not present

## 2023-05-15 DIAGNOSIS — R877 Abnormal histological findings in specimens from female genital organs: Secondary | ICD-10-CM | POA: Diagnosis not present

## 2023-05-23 ENCOUNTER — Telehealth: Payer: Self-pay | Admitting: *Deleted

## 2023-05-23 NOTE — Telephone Encounter (Signed)
Spoke with the patient regarding the referral to GYN oncology. Patient scheduled as new patient with Dr Pricilla Holm on 7/11 at 11:15 am. Patient given an arrival time of 10:45 am.  Explained to the patient the the doctor will perform a pelvic exam at this visit. Patient given the policy that only one visitor allowed and that visitor must be over 16 yrs are allowed in the Cancer Center. Patient given the address/phone number for the clinic and that the center offers free valet service. Patient aware that masks are option.   Patient offered earlier dates and declined due to work and requesting Dr Pricilla Holm

## 2023-06-18 ENCOUNTER — Ambulatory Visit: Payer: Commercial Managed Care - PPO | Admitting: Psychiatry

## 2023-06-26 ENCOUNTER — Encounter: Payer: Self-pay | Admitting: Gynecologic Oncology

## 2023-06-27 NOTE — Progress Notes (Signed)
GYNECOLOGIC ONCOLOGY NEW PATIENT CONSULTATION   Patient Name: Debra Armstrong  Patient Age: 52 y.o. Date of Service: 06/29/23 Referring Provider: Maxie Better, MD  Primary Care Provider: Arnette Felts, FNP Consulting Provider: Eugene Garnet, MD   Assessment/Plan:  Postmenopausal patient with persistent low-grade dysplasia, high risk HPV positive.  The patient and I reviewed her recent Pap smear history.  Discussed HPV testing which has shown that she is positive for high risk HPV.  We reviewed the role that HPV plays in the development of precancerous and cancerous lesions of the cervix.  We discussed risk factors and cofactors for HPV related development of dysplasia and malignancy.  She is a non-smoker without any medical comorbidities that would lead to immunosuppression nor medications that would do so.  She does not think that she has had recent HIV testing and I offered to do this today.  In terms of low-grade cervical dysplasia, we discussed the low risk of progression to cancer.  Typically, this is followed closely as many patients will have spontaneous regression.  In patients who test positive for high risk HPV for 2 years or more, while close observation is preferred, we discussed the possibility of proceeding with treatment (excisional procedure).   I suggested that we perform HPV testing and specifically request HPV subtyping.  If HPV subtyping is positive for a less oncogenic strains of HPV, I would be inclined to favor continue close observation with repeat cotesting 1 year after her recent colposcopy.  If HPV subtyping is positive for HPV 16, 18, or 45, I think it is reasonable to consider either close observation or moving forward with treatment.  We discussed vaginal atrophy and the role of vaginal estrogen.  Specifically, prior to speculum exams and colposcopic procedures, vaginal estrogen may significantly improve the ability to perform these exams and help decrease  discomfort for the patient.  I will call the patient with her HPV results next week.  A copy of this note was sent to the patient's referring provider.   55 minutes of total time was spent for this patient encounter, including preparation, face-to-face counseling with the patient and coordination of care, and documentation of the encounter.  Eugene Garnet, MD  Division of Gynecologic Oncology  Department of Obstetrics and Gynecology  [facility]  ___________________________________________  Chief Complaint: Chief Complaint  Patient presents with   Endometrial cancer [facility])    History of Present Illness:  Debra Armstrong is a 52 y.o. y.o. female who is seen in consultation at the request of Dr. Cherly Hensen for an evaluation of abnormal Pap smear, positive high-risk HPV.  The patient reports having an abnormal Pap smear while in college.  This did not require any follow-up other than repeat Pap test.  She then had normal Pap smears for years until 2022.  Her recent history is noted below:  03/2021: ASCUS pap, HR HPV +. 03/2021: Colposcopy.  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 06/01/2021: LEEP.  Anterior portion and posterior portion both with CIN1.  P16 reveals only patchy positive.  CIN-1 extends to both margins in the sections corresponding to 3-9 o'clock.  CIN extends to ectocervical margin corresponding to 9-3:00. 10/2021: ASCUS pap, HR HPV +. 03/2022: ASCUS pap, HR HPV +. 03/2023: ASCUS pap, HR HPV+. 05/15/2023: Colposcopy.  Cervical biopsy at 10:00 showed CIN1.  ECC with CIN1, endocervical glands without significant diagnostic alteration.  No glandular hyperplasia, atypia or malignancy.  Patient reports  overall doing well today.  She denies any vaginal bleeding or discharge.  She denies any pelvic or abdominal pain.  She has constipation intermittently at baseline, unchanged.  She denies any urinary symptoms.  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  PAST MEDICAL HISTORY:  Past Medical History:  Diagnosis Date   Anemia    Constipation, chronic    Hyperthyroidism      PAST SURGICAL HISTORY:  Past Surgical History:  Procedure Laterality Date   CESAREAN SECTION  1996   [place]   CHOLECYSTECTOMY  2010   EYE SURGERY Bilateral 2019   Glaucoma, narrow angle     OB/GYN HISTORY:  OB History  Gravida Para Term Preterm AB Living  3 1 1   2 1   SAB IAB Ectopic Multiple Live Births  2            # Outcome Date GA Lbr Len/2nd Weight Sex Type Anes PTL Lv  3 Term           2 SAB           1 SAB             Patient's last menstrual period was 10/12/2019 (approximate).  Age at menarche: 52  Age at menopause: 24 Hx of HRT: denies, had been using vaginal estrogen cream to help with pelvic exam Hx of STDs: HPV Last pap: see HPI History of abnormal pap smears: see HPI  SCREENING STUDIES:  Last mammogram: 2024  Last colonoscopy: 2021  MEDICATIONS: Outpatient Encounter Medications as of 06/28/2023  Medication Sig   cholecalciferol (VITAMIN D3) 25 MCG (1000 UNIT) tablet Take 1,000 Units by mouth daily.   Docusate Sodium (COLACE PO) Take by mouth. As needed   ibuprofen (ADVIL) 800 MG tablet Take 1 tablet (800 mg total) by mouth 3 (three) times daily.   [DISCONTINUED] ALPRAZolam (XANAX) 0.25 MG tablet Take 1 tablet (0.25 mg total) by mouth every 8 (eight) hours as needed for anxiety   [DISCONTINUED] atovaquone-proguanil (MALARONE) 250-100 MG TABS tablet Take 1 tablet by mouth daily. Begin 2 days prearrival, take daily during stay & continue for 7 days post travel for malaria prevention   [DISCONTINUED] azithromycin (ZITHROMAX) 500 MG tablet For nonbloody diarrhea take 2 tabs on day 1,if resolved stop med. If diarrhea persists take 1 tab on day 2 & 3. For bloody diarrhea take 2 tabs on day 1 and 1 tab days 2 &3   [DISCONTINUED] estradiol (ESTRACE) 0.1 MG/GM vaginal cream Insert 0.5 g  vaginally daily for 14 days.   [DISCONTINUED] folic acid (FOLVITE) 1 MG tablet Take 1 tablet (1 mg total) by mouth daily.   [DISCONTINUED] metroNIDAZOLE (FLAGYL) 500 MG tablet Take 1 tablet (500 mg total) by mouth 2 (two) times daily.   [DISCONTINUED] nitrofurantoin, macrocrystal-monohydrate, (MACROBID) 100 MG capsule Take 1 Capsule by mouth two times a day   [DISCONTINUED] scopolamine (TRANSDERM-SCOP) 1 MG/3DAYS Place 1 patch (1.5 mg total) onto the skin as directed by md   No facility-administered encounter medications on file as of 06/28/2023.    ALLERGIES:  Allergies  Allergen Reactions   Sulfa Antibiotics Anaphylaxis     FAMILY HISTORY:  Family History  Problem Relation Age of Onset   Diabetes Mother    Hyperlipidemia Mother    Hypertension Maternal Grandmother    Glaucoma Maternal Grandmother    Heart disease Maternal Grandfather        Valve relacement, Bypass  Diabetes Paternal Grandmother    Cancer Maternal Great-grandmother    Colon cancer Maternal Great-grandmother    Breast cancer Neg Hx    Ovarian cancer Neg Hx    Endometrial cancer Neg Hx    Pancreatic cancer Neg Hx    Cancer - Prostate Neg Hx      SOCIAL HISTORY:  Social Connections: Somewhat Isolated (05/15/2018)   Social Connection and Isolation Panel [NHANES]    Frequency of Communication with Friends and Family: More than three times a week    Frequency of Social Gatherings with Friends and Family: More than three times a week    Attends Religious Services: More than 4 times per year    Active Member of [employer] or Organizations: No    Attends Banker Meetings: Never    Marital Status: Never married    REVIEW OF SYSTEMS:  Denies appetite changes, fevers, chills, fatigue, unexplained weight changes. Denies hearing loss, neck lumps or masses, mouth sores, ringing in ears or voice changes. Denies cough or wheezing.  Denies shortness of breath. Denies chest pain or palpitations. Denies leg  swelling. Denies abdominal distention, pain, blood in stools, constipation, diarrhea, nausea, vomiting, or early satiety. Denies pain with intercourse, dysuria, frequency, hematuria or incontinence. Denies hot flashes, pelvic pain, vaginal bleeding or vaginal discharge.   Denies joint pain, back pain or muscle pain/cramps. Denies itching, rash, or wounds. Denies dizziness, headaches, numbness or seizures. Denies swollen lymph nodes or glands, denies easy bruising or bleeding. Denies anxiety, depression, confusion, or decreased concentration.  Physical Exam:  Vital Signs for this encounter:  Blood pressure 123/71, pulse 79, temperature 97.8 F (36.6 C), resp. rate 17, height 5\' 1"  (1.549 m), weight 129 lb 9.6 oz (58.8 kg), last menstrual period 10/12/2019, SpO2 100%. Body mass index is 24.49 kg/m. General: Alert, oriented, no acute distress.  HEENT: Normocephalic, atraumatic. Sclera anicteric.  Chest: Clear to auscultation bilaterally. No wheezes, rhonchi, or rales. Cardiovascular: Regular rate and rhythm, no murmurs, rubs, or gallops.  Abdomen: Normoactive bowel sounds. Soft, nondistended, nontender to palpation. No masses or hepatosplenomegaly appreciated. No palpable fluid wave.  Extremities: Grossly normal range of motion. Warm, well perfused. No edema bilaterally.  Skin: No rashes or lesions.  Lymphatics: No cervical, supraclavicular, or inguinal adenopathy.  GU:  Normal external female genitalia. No lesions. No discharge or bleeding.             Bladder/urethra:  No lesions or masses, well supported bladder             Vagina: Mild-moderate vaginal atrophy, no vaginal lesions.             Cervix: Normal appearing, no lesions.  HPV collected.             Uterus: Small, mobile, no parametrial involvement or nodularity.             Adnexa: No masses appreciated.  Rectal: Deferred.  LABORATORY AND RADIOLOGIC DATA:  Outside medical records were reviewed to synthesize the above  history, along with the history and physical obtained during the visit.   Lab Results  Component Value Date   WBC 4.7 07/31/2022   HGB 13.9 07/31/2022   HCT 42.4 07/31/2022   PLT 312 07/31/2022   GLUCOSE 81 07/31/2022   CHOL 218 (H) 07/31/2022   TRIG 38 07/31/2022   HDL 112 07/31/2022   LDLCALC 99 07/31/2022   ALT 10 07/31/2022   AST 15 07/31/2022   NA 142 07/31/2022  K 4.2 07/31/2022   CL 103 07/31/2022   CREATININE 0.74 07/31/2022   BUN 10 07/31/2022   CO2 25 07/31/2022   TSH 0.94 07/19/2020   HGBA1C 5.5 07/31/2022

## 2023-06-28 ENCOUNTER — Encounter: Payer: Self-pay | Admitting: Gynecologic Oncology

## 2023-06-28 ENCOUNTER — Inpatient Hospital Stay: Payer: 59 | Attending: Psychiatry | Admitting: Gynecologic Oncology

## 2023-06-28 ENCOUNTER — Other Ambulatory Visit (HOSPITAL_COMMUNITY)
Admission: RE | Admit: 2023-06-28 | Discharge: 2023-06-28 | Disposition: A | Discharge status: Home / Self Care | Payer: 59 | Source: Ambulatory Visit | Attending: Gynecologic Oncology | Admitting: Gynecologic Oncology

## 2023-06-28 ENCOUNTER — Other Ambulatory Visit: Payer: Self-pay

## 2023-06-28 ENCOUNTER — Inpatient Hospital Stay: Payer: 59

## 2023-06-28 VITALS — BP 123/71 | HR 79 | Temp 97.8°F | Resp 17 | Ht 61.0 in | Wt 129.6 lb

## 2023-06-28 DIAGNOSIS — E0590 Thyrotoxicosis, unspecified without thyrotoxic crisis or storm: Secondary | ICD-10-CM | POA: Diagnosis not present

## 2023-06-28 DIAGNOSIS — Z1151 Encounter for screening for human papillomavirus (HPV): Secondary | ICD-10-CM | POA: Insufficient documentation

## 2023-06-28 DIAGNOSIS — K5900 Constipation, unspecified: Secondary | ICD-10-CM | POA: Diagnosis not present

## 2023-06-28 DIAGNOSIS — R87810 Cervical high risk human papillomavirus (HPV) DNA test positive: Secondary | ICD-10-CM | POA: Insufficient documentation

## 2023-06-28 DIAGNOSIS — Z800 Family history of malignant neoplasm of digestive organs: Secondary | ICD-10-CM | POA: Insufficient documentation

## 2023-06-28 DIAGNOSIS — C541 Malignant neoplasm of endometrium: Secondary | ICD-10-CM

## 2023-06-28 DIAGNOSIS — N952 Postmenopausal atrophic vaginitis: Secondary | ICD-10-CM | POA: Diagnosis not present

## 2023-06-28 DIAGNOSIS — D069 Carcinoma in situ of cervix, unspecified: Secondary | ICD-10-CM

## 2023-06-28 NOTE — Patient Instructions (Signed)
It was very nice to meet you today.  I will call you when I get your HPV test results and we will discuss next steps.

## 2023-06-29 DIAGNOSIS — R87810 Cervical high risk human papillomavirus (HPV) DNA test positive: Secondary | ICD-10-CM | POA: Insufficient documentation

## 2023-06-29 DIAGNOSIS — N952 Postmenopausal atrophic vaginitis: Secondary | ICD-10-CM | POA: Insufficient documentation

## 2023-06-29 DIAGNOSIS — D069 Carcinoma in situ of cervix, unspecified: Secondary | ICD-10-CM | POA: Insufficient documentation

## 2023-06-29 LAB — HIV ANTIBODY (ROUTINE TESTING W REFLEX): HIV Screen 4th Generation wRfx: NONREACTIVE

## 2023-07-05 LAB — CERVICOVAGINAL ANCILLARY ONLY
Comment: NEGATIVE
Comment: NEGATIVE
Comment: NEGATIVE
HPV 16: NEGATIVE
HPV 18 / 45: NEGATIVE
High risk HPV: POSITIVE — AB

## 2023-07-06 ENCOUNTER — Telehealth: Payer: Self-pay | Admitting: Gynecologic Oncology

## 2023-07-06 ENCOUNTER — Encounter: Payer: Self-pay | Admitting: Obstetrics and Gynecology

## 2023-07-06 NOTE — Telephone Encounter (Signed)
Called and discussed HPV testing - 16/18/45 negative. Reviewed recommendation for close follow-up with cotesting in 1 year. Patient voiced understanding.  Eugene Garnet MD Gynecologic Oncology

## 2023-08-08 ENCOUNTER — Ambulatory Visit (INDEPENDENT_AMBULATORY_CARE_PROVIDER_SITE_OTHER): Payer: 59 | Admitting: Nurse Practitioner

## 2023-08-08 ENCOUNTER — Encounter: Payer: Self-pay | Admitting: Nurse Practitioner

## 2023-08-08 VITALS — BP 110/70 | HR 80 | Temp 98.1°F | Ht 61.0 in | Wt 129.8 lb

## 2023-08-08 DIAGNOSIS — R7309 Other abnormal glucose: Secondary | ICD-10-CM | POA: Diagnosis not present

## 2023-08-08 DIAGNOSIS — Z0000 Encounter for general adult medical examination without abnormal findings: Secondary | ICD-10-CM

## 2023-08-08 DIAGNOSIS — K5900 Constipation, unspecified: Secondary | ICD-10-CM | POA: Diagnosis not present

## 2023-08-08 DIAGNOSIS — E7800 Pure hypercholesterolemia, unspecified: Secondary | ICD-10-CM

## 2023-08-08 DIAGNOSIS — E559 Vitamin D deficiency, unspecified: Secondary | ICD-10-CM

## 2023-08-08 NOTE — Progress Notes (Addendum)
Madelaine Bhat, CMA,acting as a Neurosurgeon for Arnette Felts, FNP.,have documented all relevant documentation on the behalf of Arnette Felts, FNP,as directed by  Arnette Felts, FNP while in the presence of Arnette Felts, FNP.  Subjective:    Patient ID: Debra Armstrong , female    DOB: [birth year] , 52 y.o.   MRN: [id number]  Chief Complaint  Patient presents with   Annual Exam    HPI  Patient presents today for HM, patient reports compliance with medications. Patient denies any chest pain, SOB, or headaches. Patient has no concerns today. Letter sent for mammogram and PAP. She has seen Dr. Cherly Hensen for her routine care.      Past Medical History:  Diagnosis Date   Allergy 2012   Anemia    Blood transfusion without reported diagnosis 2014   Very heavy menstrual cycle   Constipation, chronic    Hyperthyroidism      Family History  Problem Relation Age of Onset   Diabetes Mother    Hyperlipidemia Mother    Hypertension Maternal Grandmother    Glaucoma Maternal Grandmother    Heart disease Maternal Grandfather        Valve relacement, Bypass    Diabetes Paternal Grandmother    Cancer Maternal Great-grandmother    Colon cancer Maternal Great-grandmother    Breast cancer Neg Hx    Ovarian cancer Neg Hx    Endometrial cancer Neg Hx    Pancreatic cancer Neg Hx    Cancer - Prostate Neg Hx      Current Outpatient Medications:    cholecalciferol (VITAMIN D3) 25 MCG (1000 UNIT) tablet, Take 1,000 Units by mouth daily., Disp: , Rfl:    Docusate Sodium (COLACE PO), Take by mouth. As needed, Disp: , Rfl:    ibuprofen (ADVIL) 800 MG tablet, Take 1 tablet (800 mg total) by mouth 3 (three) times daily., Disp: 30 tablet, Rfl: 2   Allergies  Allergen Reactions   Sulfa Antibiotics Anaphylaxis      The patient states she uses post menopausal status for birth control. Patient's last menstrual period was 10/12/2019 (approximate).  Negative for: breast discharge, breast lump(s), breast pain  and breast self exam. Associated symptoms include abnormal vaginal bleeding. Pertinent negatives include abnormal bleeding (hematology), anxiety, decreased libido, depression, difficulty falling sleep, dyspareunia, history of infertility, nocturia, sexual dysfunction, sleep disturbances, urinary incontinence, urinary urgency, vaginal discharge and vaginal itching. Diet regular; does admit needs to increase fiber intake because of constipation. She does not drink much water.  The patient states her exercise level is none.   The patient's tobacco use is:  Social History   Tobacco Use  Smoking Status Never  Smokeless Tobacco Never   She has been exposed to passive smoke. The patient's alcohol use is:  Social History   Substance and Sexual Activity  Alcohol Use Never  Additional information: Last pap 02/2020 not scheduled for her next one, will get most recent records  Review of Systems  Constitutional: Negative.   HENT: Negative.    Eyes: Negative.   Respiratory: Negative.    Cardiovascular: Negative.   Gastrointestinal: Negative.   Endocrine: Negative.   Genitourinary: Negative.   Musculoskeletal: Negative.   Skin: Negative.   Allergic/Immunologic: Negative.   Neurological: Negative.   Hematological: Negative.   Psychiatric/Behavioral: Negative.       Today's Vitals   08/08/23 1010  BP: 110/70  Pulse: 80  Temp: 98.1 F (36.7 C)  TempSrc: Oral  Weight: 129 lb 12.8  oz (58.9 kg)  Height: 5\' 1"  (1.549 m)  PainSc: 0-No pain   Body mass index is 24.53 kg/m.  Wt Readings from Last 3 Encounters:  08/08/23 129 lb 12.8 oz (58.9 kg)  06/28/23 129 lb 9.6 oz (58.8 kg)  07/31/22 127 lb (57.6 kg)     Objective:  Physical Exam Constitutional:      General: She is not in acute distress.    Appearance: Normal appearance. She is well-developed and normal weight.  HENT:     Head: Normocephalic and atraumatic.     Right Ear: Hearing, tympanic membrane, ear canal and external ear  normal. There is no impacted cerumen.     Left Ear: Hearing, tympanic membrane, ear canal and external ear normal. There is no impacted cerumen.     Nose: Nose normal.     Mouth/Throat:     Mouth: Mucous membranes are moist.  Eyes:     General: Lids are normal.     Extraocular Movements: Extraocular movements intact.     Conjunctiva/sclera: Conjunctivae normal.     Pupils: Pupils are equal, round, and reactive to light.     Funduscopic exam:    Right eye: No papilledema.        Left eye: No papilledema.  Neck:     Thyroid: No thyroid mass.     Vascular: No carotid bruit.  Cardiovascular:     Rate and Rhythm: Normal rate and regular rhythm.     Pulses: Normal pulses.     Heart sounds: Normal heart sounds. No murmur heard. Pulmonary:     Effort: Pulmonary effort is normal. No respiratory distress.     Breath sounds: Normal breath sounds. No wheezing.  Chest:     Chest wall: No mass.  Breasts:    Tanner Score is 5.     Right: Normal. No mass or tenderness.     Left: Normal. No mass or tenderness.  Abdominal:     General: Abdomen is flat. Bowel sounds are normal. There is no distension.     Palpations: Abdomen is soft.     Tenderness: There is no abdominal tenderness.  Genitourinary:    Comments: Deferred - followed by GYN - Dr. Cherly Hensen Musculoskeletal:        General: No swelling. Normal range of motion.     Cervical back: Full passive range of motion without pain, normal range of motion and neck supple.     Right lower leg: No edema.     Left lower leg: No edema.  Lymphadenopathy:     Upper Body:     Right upper body: No supraclavicular, axillary or pectoral adenopathy.     Left upper body: No supraclavicular, axillary or pectoral adenopathy.  Skin:    General: Skin is warm and dry.     Capillary Refill: Capillary refill takes less than 2 seconds.  Neurological:     General: No focal deficit present.     Mental Status: She is alert and oriented to person, place, and  time.     Cranial Nerves: No cranial nerve deficit.     Sensory: No sensory deficit.     Motor: No weakness.  Psychiatric:        Mood and Affect: Mood normal.        Behavior: Behavior normal.        Thought Content: Thought content normal.        Judgment: Judgment normal.  Assessment And Plan:     Encounter for annual health examination Assessment & Plan: Behavior modifications discussed and diet history reviewed.   Pt will continue to exercise regularly and modify diet with low GI, plant based foods and decrease intake of processed foods.  Recommend intake of daily multivitamin, Vitamin D, and calcium.  Recommend mammogram and colonoscopy for preventive screenings, as well as recommend immunizations that include influenza, TDAP, and Shingles   Orders: -     CBC with Differential/Platelet  Vitamin D deficiency Assessment & Plan: Will check vitamin D level and supplement as needed.    Also encouraged to spend 15 minutes in the sun daily.    Orders: -     VITAMIN D 25 Hydroxy (Vit-D Deficiency, Fractures)  Abnormal glucose Assessment & Plan: HgbA1c is stable, diet controlled. Will check HgbA1c today. Continue healthy diet low in sugar and starches.   Orders: -     Hemoglobin A1c  Elevated cholesterol Assessment & Plan: Cholesterol levels were slightly increased. Continue focusing on low fat diet. Will check lipid panel today  Orders: -     Lipid panel  Constipation, unspecified constipation type Assessment & Plan: Discussed the importance of increasing fiber intake and encouraged to take a probiotic  Orders: -     [id number]+EGFR     Return for 1 year physical. Patient was given opportunity to ask questions. Patient verbalized understanding of the plan and was able to repeat key elements of the plan. All questions were answered to their satisfaction.   Arnette Felts, FNP  I, Arnette Felts, FNP, have reviewed all documentation for this visit. The  documentation on 08/08/23 for the exam, diagnosis, procedures, and orders are all accurate and complete.

## 2023-08-09 LAB — CBC WITH DIFFERENTIAL/PLATELET
Basophils Absolute: 0 10*3/uL (ref 0.0–0.2)
Basos: 1 %
EOS (ABSOLUTE): 0.2 10*3/uL (ref 0.0–0.4)
Eos: 4 %
Hematocrit: 39.9 % (ref 34.0–46.6)
Hemoglobin: 13.5 g/dL (ref 11.1–15.9)
Immature Grans (Abs): 0 10*3/uL (ref 0.0–0.1)
Immature Granulocytes: 0 %
Lymphocytes Absolute: 1.6 10*3/uL (ref 0.7–3.1)
Lymphs: 45 %
MCH: 31.4 pg (ref 26.6–33.0)
MCHC: 33.8 g/dL (ref 31.5–35.7)
MCV: 93 fL (ref 79–97)
Monocytes Absolute: 0.5 10*3/uL (ref 0.1–0.9)
Monocytes: 13 %
Neutrophils Absolute: 1.3 10*3/uL — ABNORMAL LOW (ref 1.4–7.0)
Neutrophils: 37 %
Platelets: 290 10*3/uL (ref 150–450)
RBC: 4.3 x10E6/uL (ref 3.77–5.28)
RDW: 11.6 % — ABNORMAL LOW (ref 11.7–15.4)
WBC: 3.6 10*3/uL (ref 3.4–10.8)

## 2023-08-09 LAB — CMP14+EGFR
ALT: 8 IU/L (ref 0–32)
AST: 14 IU/L (ref 0–40)
Albumin: 4.7 g/dL (ref 3.8–4.9)
Alkaline Phosphatase: 111 IU/L (ref 44–121)
BUN/Creatinine Ratio: 11 (ref 9–23)
BUN: 9 mg/dL (ref 6–24)
Bilirubin Total: 0.6 mg/dL (ref 0.0–1.2)
CO2: 25 mmol/L (ref 20–29)
Calcium: 9.8 mg/dL (ref 8.7–10.2)
Chloride: 102 mmol/L (ref 96–106)
Creatinine, Ser: 0.83 mg/dL (ref 0.57–1.00)
Globulin, Total: 2.8 g/dL (ref 1.5–4.5)
Glucose: 88 mg/dL (ref 70–99)
Potassium: 4.6 mmol/L (ref 3.5–5.2)
Sodium: 141 mmol/L (ref 134–144)
Total Protein: 7.5 g/dL (ref 6.0–8.5)
eGFR: 85 mL/min/{1.73_m2} (ref >59)

## 2023-08-09 LAB — LIPID PANEL
Chol/HDL Ratio: 2 ratio (ref 0.0–4.4)
Cholesterol, Total: 195 mg/dL (ref 100–199)
HDL: 100 mg/dL (ref >39)
LDL Chol Calc (NIH): 86 mg/dL (ref 0–99)
Triglycerides: 43 mg/dL (ref 0–149)
VLDL Cholesterol Cal: 9 mg/dL (ref 5–40)

## 2023-08-09 LAB — VITAMIN D 25 HYDROXY (VIT D DEFICIENCY, FRACTURES): Vit D, 25-Hydroxy: 28.3 ng/mL — ABNORMAL LOW (ref 30.0–100.0)

## 2023-08-09 LAB — HEMOGLOBIN A1C
Est. average glucose Bld gHb Est-mCnc: 117 mg/dL
Hgb A1c MFr Bld: 5.7 % — ABNORMAL HIGH (ref 4.8–5.6)

## 2023-08-14 ENCOUNTER — Encounter: Payer: Self-pay | Admitting: Nurse Practitioner

## 2023-08-16 DIAGNOSIS — E7800 Pure hypercholesterolemia, unspecified: Secondary | ICD-10-CM | POA: Insufficient documentation

## 2023-08-16 DIAGNOSIS — R7309 Other abnormal glucose: Secondary | ICD-10-CM | POA: Insufficient documentation

## 2023-08-16 DIAGNOSIS — K5900 Constipation, unspecified: Secondary | ICD-10-CM | POA: Insufficient documentation

## 2023-08-16 DIAGNOSIS — Z0000 Encounter for general adult medical examination without abnormal findings: Secondary | ICD-10-CM | POA: Insufficient documentation

## 2023-08-16 DIAGNOSIS — E559 Vitamin D deficiency, unspecified: Secondary | ICD-10-CM | POA: Insufficient documentation

## 2023-08-16 NOTE — Assessment & Plan Note (Signed)

## 2023-08-16 NOTE — Assessment & Plan Note (Signed)
Discussed the importance of increasing fiber intake and encouraged to take a probiotic

## 2023-08-16 NOTE — Assessment & Plan Note (Signed)
Cholesterol levels were slightly increased. Continue focusing on low fat diet. Will check lipid panel today

## 2023-08-16 NOTE — Assessment & Plan Note (Signed)
HgbA1c is stable, diet controlled. Will check HgbA1c today. Continue healthy diet low in sugar and starches.

## 2023-08-16 NOTE — Assessment & Plan Note (Signed)
Will check vitamin D level and supplement as needed.    Also encouraged to spend 15 minutes in the sun daily.   

## 2023-08-17 ENCOUNTER — Encounter: Payer: Self-pay | Admitting: Nurse Practitioner

## 2023-10-10 ENCOUNTER — Other Ambulatory Visit (HOSPITAL_COMMUNITY): Payer: Self-pay

## 2023-10-10 MED ORDER — CYCLOBENZAPRINE HCL 10 MG PO TABS
10.0000 mg | ORAL_TABLET | Freq: Three times a day (TID) | ORAL | 1 refills | Status: AC | PRN
  Filled 2023-10-10: qty 30, 10d supply, fill #0
  Filled 2024-03-20: qty 30, 10d supply, fill #1

## 2023-10-11 DIAGNOSIS — H5203 Hypermetropia, bilateral: Secondary | ICD-10-CM | POA: Diagnosis not present

## 2023-12-13 ENCOUNTER — Other Ambulatory Visit (HOSPITAL_COMMUNITY): Payer: Self-pay

## 2023-12-13 MED ORDER — IBUPROFEN 800 MG PO TABS
800.0000 mg | ORAL_TABLET | Freq: Four times a day (QID) | ORAL | 11 refills | Status: DC | PRN
  Filled 2023-12-13: qty 30, 8d supply, fill #0

## 2023-12-31 ENCOUNTER — Encounter: Payer: Self-pay | Admitting: Nurse Practitioner

## 2023-12-31 ENCOUNTER — Ambulatory Visit: Payer: Commercial Managed Care - PPO | Admitting: Nurse Practitioner

## 2023-12-31 ENCOUNTER — Other Ambulatory Visit (HOSPITAL_COMMUNITY): Payer: Self-pay

## 2023-12-31 VITALS — BP 110/80 | HR 82 | Temp 98.1°F | Ht 62.6 in | Wt 133.8 lb

## 2023-12-31 DIAGNOSIS — M542 Cervicalgia: Secondary | ICD-10-CM | POA: Diagnosis not present

## 2023-12-31 DIAGNOSIS — Z2821 Immunization not carried out because of patient refusal: Secondary | ICD-10-CM

## 2023-12-31 DIAGNOSIS — M25512 Pain in left shoulder: Secondary | ICD-10-CM

## 2023-12-31 MED ORDER — MELOXICAM 7.5 MG PO TABS
7.5000 mg | ORAL_TABLET | Freq: Every day | ORAL | 2 refills | Status: AC
Start: 2023-12-31 — End: 2024-12-30
  Filled 2023-12-31: qty 30, 30d supply, fill #0

## 2023-12-31 NOTE — Progress Notes (Signed)
 LILLETTE Kristeen JINNY Gladis, CMA,acting as a neurosurgeon for [name], FNP.,have documented all relevant documentation on the behalf of [name], FNP,as directed by  [name], FNP while in the presence of [name], FNP.  Subjective:  Patient ID: Debra Armstrong , female    DOB: Aug 27, 1971 , 53 y.o.   MRN: [id number]  Chief Complaint  Patient presents with   Shoulder Pain    HPI  Patient presents today for left shoulder pain and neck pain that started weeks ago, initially started in October was an annoying type of pain then last weekend she began having numbness to her fingers, patient reports now her left arm and fingers are hurting. She is also having weakness. Patient reports her left side is having numbness that goes down to her thumb in her hand. She has been taking ibuprofen /flexeril . She is not having difficulty holding items but today when trying to pick up with left hand felt weakness. She can feel a difference trying to curl her hair and maneuver.      Past Medical History:  Diagnosis Date   Allergy 2012   Anemia    Blood transfusion without reported diagnosis 2014   Very heavy menstrual cycle   Constipation, chronic    Hyperthyroidism      Family History  Problem Relation Age of Onset   Diabetes Mother    Hyperlipidemia Mother    Hypertension Maternal Grandmother    Glaucoma Maternal Grandmother    Heart disease Maternal Grandfather        Valve relacement, Bypass    Diabetes Paternal Grandmother    Cancer Maternal Great-grandmother    Colon cancer Maternal Great-grandmother    Breast cancer Neg Hx    Ovarian cancer Neg Hx    Endometrial cancer Neg Hx    Pancreatic cancer Neg Hx    Cancer - Prostate Neg Hx      Current Outpatient Medications:    cholecalciferol (VITAMIN D3) 25 MCG (1000 UNIT) tablet, Take 1,000 Units by mouth daily., Disp: , Rfl:    cyclobenzaprine  (FLEXERIL ) 10 MG tablet, Take 1 tablet (10 mg total) by mouth every 8 (eight) hours as needed.,  Disp: 30 tablet, Rfl: 1   ibuprofen  (ADVIL ) 800 MG tablet, Take 1 tablet (800 mg total) by mouth 3 (three) times daily., Disp: 30 tablet, Rfl: 2   meloxicam  (MOBIC ) 7.5 MG tablet, Take 1 tablet (7.5 mg total) by mouth daily., Disp: 30 tablet, Rfl: 2   Allergies  Allergen Reactions   Sulfa Antibiotics Anaphylaxis     Review of Systems  Constitutional: Negative.   Eyes: Negative.   Respiratory: Negative.    Cardiovascular: Negative.   Musculoskeletal:  Positive for neck pain.       Shoulder pain left  Skin: Negative.   Neurological:  Positive for numbness (left hand).  Psychiatric/Behavioral: Negative.       Today's Vitals   12/31/23 1602  BP: 110/80  Pulse: 82  Temp: 98.1 F (36.7 C)  TempSrc: Oral  Weight: 133 lb 12.8 oz (60.7 kg)  Height: 5' 2.6 (1.59 m)  PainSc: 4   PainLoc: Shoulder   Body mass index is 24.01 kg/m.  Wt Readings from Last 3 Encounters:  12/31/23 133 lb 12.8 oz (60.7 kg)  08/08/23 129 lb 12.8 oz (58.9 kg)  06/28/23 129 lb 9.6 oz (58.8 kg)     Objective:  Physical Exam Vitals reviewed.  Constitutional:      General: She is not in acute  distress.    Appearance: Normal appearance. She is well-developed.  HENT:     Head: Normocephalic and atraumatic.  Eyes:     Pupils: Pupils are equal, round, and reactive to light.  Cardiovascular:     Rate and Rhythm: Normal rate and regular rhythm.     Pulses: Normal pulses.     Heart sounds: Normal heart sounds. No murmur heard. Pulmonary:     Effort: Pulmonary effort is normal. No respiratory distress.     Breath sounds: Normal breath sounds. No wheezing.  Musculoskeletal:        General: No swelling or tenderness (left posterior shoulder at trapezius muscle). Normal range of motion.  Skin:    General: Skin is warm and dry.     Capillary Refill: Capillary refill takes less than 2 seconds.  Neurological:     General: No focal deficit present.     Mental Status: She is alert and oriented to person,  place, and time.     Cranial Nerves: No cranial nerve deficit.     Motor: No weakness.  Psychiatric:        Mood and Affect: Mood normal.       Assessment And Plan:  Acute pain of left shoulder Assessment & Plan: She is having pain radiating down her left arm will check a cervical spine xray.   Orders: -     Ambulatory referral to Chiropractic -     DG Cervical Spine Complete; Future  Neck pain Assessment & Plan: Will check cervical xray. Will also refer to a chiropractor Jolena [facility] - 1 [phone] [facility]   Orders: -     Ambulatory referral to Chiropractic -     DG Cervical Spine Complete; Future  COVID-19 vaccination declined Assessment & Plan: Declines covid 19 vaccine. Discussed risk of covid 49 and if she changes her mind about the vaccine to call the office. Education has been provided regarding the importance of this vaccine but patient still declined. Advised may receive this vaccine at local pharmacy or Health Dept.or vaccine clinic. Aware to provide a copy of the vaccination record if obtained from local pharmacy or Health Dept.  Encouraged to take multivitamin, vitamin d , vitamin c and zinc to increase immune system. Aware can call office if would like to have vaccine here at office. Verbalized acceptance and understanding.    Other orders -     Meloxicam ; Take 1 tablet (7.5 mg total) by mouth daily.  Dispense: 30 tablet; Refill: 2   Return if symptoms worsen or fail to improve.  Patient was given opportunity to ask questions. Patient verbalized understanding of the plan and was able to repeat key elements of the plan. All questions were answered to their satisfaction.    LILLETTE [name], FNP, have reviewed all documentation for this visit. The documentation on 12/31/23 for the exam, diagnosis, procedures, and orders are all accurate and complete.   IF YOU HAVE BEEN REFERRED TO A SPECIALIST, IT MAY TAKE 1-2 WEEKS TO SCHEDULE/PROCESS THE  REFERRAL. IF YOU HAVE NOT HEARD FROM US /SPECIALIST IN TWO WEEKS, PLEASE GIVE US  A CALL AT [phone].

## 2023-12-31 NOTE — Patient Instructions (Addendum)
 [address] if you can not do it at [facility]  Take the meloxicam for 5 days daily then as needed.

## 2024-01-01 ENCOUNTER — Other Ambulatory Visit (HOSPITAL_COMMUNITY): Payer: Self-pay

## 2024-01-01 ENCOUNTER — Encounter: Payer: Self-pay | Admitting: Nurse Practitioner

## 2024-01-08 ENCOUNTER — Ambulatory Visit (HOSPITAL_COMMUNITY)
Admission: RE | Admit: 2024-01-08 | Discharge: 2024-01-08 | Disposition: A | Discharge status: Home / Self Care | Payer: Commercial Managed Care - PPO | Source: Ambulatory Visit | Attending: Nurse Practitioner | Admitting: Nurse Practitioner

## 2024-01-08 DIAGNOSIS — M542 Cervicalgia: Secondary | ICD-10-CM | POA: Diagnosis not present

## 2024-01-08 DIAGNOSIS — M25512 Pain in left shoulder: Secondary | ICD-10-CM | POA: Insufficient documentation

## 2024-01-08 DIAGNOSIS — M25519 Pain in unspecified shoulder: Secondary | ICD-10-CM | POA: Diagnosis not present

## 2024-01-09 DIAGNOSIS — M25512 Pain in left shoulder: Secondary | ICD-10-CM | POA: Insufficient documentation

## 2024-01-09 DIAGNOSIS — M542 Cervicalgia: Secondary | ICD-10-CM | POA: Insufficient documentation

## 2024-01-09 DIAGNOSIS — Z2821 Immunization not carried out because of patient refusal: Secondary | ICD-10-CM | POA: Insufficient documentation

## 2024-01-09 NOTE — Assessment & Plan Note (Signed)

## 2024-01-09 NOTE — Assessment & Plan Note (Signed)
Will check cervical xray. Will also refer to a chiropractor Daron Offer - [phone] [facility]

## 2024-01-09 NOTE — Assessment & Plan Note (Signed)
She is having pain radiating down her left arm will check a cervical spine xray.

## 2024-02-18 ENCOUNTER — Other Ambulatory Visit: Payer: Self-pay

## 2024-02-18 ENCOUNTER — Other Ambulatory Visit (HOSPITAL_COMMUNITY): Payer: Self-pay

## 2024-02-18 MED ORDER — ESTRADIOL 0.1 MG/GM VA CREA
TOPICAL_CREAM | VAGINAL | 2 refills | Status: AC
Start: 2024-02-17 — End: 2024-06-09
  Filled 2024-02-18: qty 42.5, 90d supply, fill #0

## 2024-02-20 ENCOUNTER — Encounter: Payer: Self-pay | Admitting: Nurse Practitioner

## 2024-02-28 ENCOUNTER — Other Ambulatory Visit (HOSPITAL_COMMUNITY): Payer: Self-pay

## 2024-03-13 DIAGNOSIS — R350 Frequency of micturition: Secondary | ICD-10-CM | POA: Diagnosis not present

## 2024-03-14 DIAGNOSIS — R350 Frequency of micturition: Secondary | ICD-10-CM | POA: Diagnosis not present

## 2024-03-19 ENCOUNTER — Other Ambulatory Visit (HOSPITAL_COMMUNITY): Payer: Self-pay

## 2024-03-19 MED ORDER — NITROFURANTOIN MONOHYD MACRO 100 MG PO CAPS
100.0000 mg | ORAL_CAPSULE | Freq: Two times a day (BID) | ORAL | 0 refills | Status: DC
Start: 2024-03-19 — End: 2024-08-20
  Filled 2024-03-19: qty 14, 7d supply, fill #0

## 2024-03-20 ENCOUNTER — Other Ambulatory Visit (HOSPITAL_COMMUNITY): Payer: Self-pay

## 2024-04-07 DIAGNOSIS — D251 Intramural leiomyoma of uterus: Secondary | ICD-10-CM | POA: Diagnosis not present

## 2024-04-07 DIAGNOSIS — Z01419 Encounter for gynecological examination (general) (routine) without abnormal findings: Secondary | ICD-10-CM | POA: Diagnosis not present

## 2024-04-07 DIAGNOSIS — Z1231 Encounter for screening mammogram for malignant neoplasm of breast: Secondary | ICD-10-CM | POA: Diagnosis not present

## 2024-04-07 DIAGNOSIS — N870 Mild cervical dysplasia: Secondary | ICD-10-CM | POA: Diagnosis not present

## 2024-04-07 DIAGNOSIS — N952 Postmenopausal atrophic vaginitis: Secondary | ICD-10-CM | POA: Diagnosis not present

## 2024-04-14 DIAGNOSIS — R350 Frequency of micturition: Secondary | ICD-10-CM | POA: Diagnosis not present

## 2024-04-29 DIAGNOSIS — R350 Frequency of micturition: Secondary | ICD-10-CM | POA: Diagnosis not present

## 2024-05-26 ENCOUNTER — Other Ambulatory Visit (HOSPITAL_COMMUNITY): Payer: Self-pay

## 2024-05-26 ENCOUNTER — Other Ambulatory Visit: Payer: Self-pay | Admitting: Nurse Practitioner

## 2024-05-26 DIAGNOSIS — N946 Dysmenorrhea, unspecified: Secondary | ICD-10-CM

## 2024-05-29 ENCOUNTER — Other Ambulatory Visit (HOSPITAL_COMMUNITY): Payer: Self-pay

## 2024-05-29 ENCOUNTER — Other Ambulatory Visit: Payer: Self-pay

## 2024-05-29 MED ORDER — IBUPROFEN 800 MG PO TABS
800.0000 mg | ORAL_TABLET | Freq: Three times a day (TID) | ORAL | 2 refills | Status: AC
  Filled 2024-05-29: qty 30, 10d supply, fill #0

## 2024-06-02 ENCOUNTER — Other Ambulatory Visit (HOSPITAL_COMMUNITY): Payer: Self-pay

## 2024-08-12 ENCOUNTER — Encounter: Payer: 59 | Admitting: Nurse Practitioner

## 2024-08-20 ENCOUNTER — Ambulatory Visit (INDEPENDENT_AMBULATORY_CARE_PROVIDER_SITE_OTHER): Payer: 59 | Admitting: Nurse Practitioner

## 2024-08-20 ENCOUNTER — Encounter: Payer: Self-pay | Admitting: Nurse Practitioner

## 2024-08-20 VITALS — BP 108/64 | HR 72 | Temp 98.5°F | Ht 62.0 in | Wt 130.0 lb

## 2024-08-20 DIAGNOSIS — K5904 Chronic idiopathic constipation: Secondary | ICD-10-CM | POA: Diagnosis not present

## 2024-08-20 DIAGNOSIS — E559 Vitamin D deficiency, unspecified: Secondary | ICD-10-CM

## 2024-08-20 DIAGNOSIS — R7309 Other abnormal glucose: Secondary | ICD-10-CM | POA: Diagnosis not present

## 2024-08-20 DIAGNOSIS — Z2821 Immunization not carried out because of patient refusal: Secondary | ICD-10-CM

## 2024-08-20 DIAGNOSIS — Z139 Encounter for screening, unspecified: Secondary | ICD-10-CM | POA: Diagnosis not present

## 2024-08-20 DIAGNOSIS — E7800 Pure hypercholesterolemia, unspecified: Secondary | ICD-10-CM

## 2024-08-20 DIAGNOSIS — Z0000 Encounter for general adult medical examination without abnormal findings: Secondary | ICD-10-CM

## 2024-08-20 DIAGNOSIS — M542 Cervicalgia: Secondary | ICD-10-CM

## 2024-08-20 NOTE — Patient Instructions (Signed)
 Health Maintenance  Topic Date Due   Hepatitis B Vaccine (1 of 3 - 19+ 3-dose series) Never done   Mammogram  04/01/2024   COVID-19 Vaccine (4 - 2025-26 season) 09/05/2024*   Flu Shot  03/17/2025*   Pneumococcal Vaccine for age over 74 (1 of 1 - PCV) 08/20/2025*   Pap with HPV screening  03/10/2025   DTaP/Tdap/Td vaccine (2 - Td or Tdap) 07/14/2029   Colon Cancer Screening  04/30/2030   Hepatitis C Screening  Completed   HIV Screening  Completed   Zoster (Shingles) Vaccine  Completed   HPV Vaccine  Aged Out   Meningitis B Vaccine  Aged Out  *Topic was postponed. The date shown is not the original due date.

## 2024-08-20 NOTE — Progress Notes (Signed)
 LILLETTE Kristeen JINNY Gladis, CMA,acting as a Neurosurgeon for Debra Ada, FNP.,have documented all relevant documentation on the behalf of Debra Ada, FNP,as directed by  Debra Ada, FNP while in the presence of Debra Ada, FNP.  Subjective:  Patient ID: Debra Armstrong , female    DOB: 02/19/1971 , 53 y.o.   MRN: [id number]  Chief Complaint  Patient presents with   Annual Exam    Patient presents today for HM, Patient reports compliance with medication. Patient denies any chest pain, SOB, or headaches. Patient has no concerns today.      HPI  Discussed the use of AI scribe software for clinical note transcription with the patient, who gave verbal consent to proceed.  History of Present Illness Debra Armstrong is a 53 year old female who presents for an annual physical exam.  She has a history of intermittent left shoulder and neck pain radiating down the left arm with associated numbness in the thumb. This pain first occurred in January, lasted several weeks, and resolved spontaneously. An x-ray at that time showed no abnormalities. She was prescribed meloxicam  and used ibuprofen  for relief. The pain has recurred intermittently but is not currently present.  She experiences chronic constipation. She does not engage in regular exercise and does not take any regular medications or probiotics.  She had a mammogram in April, coordinated with her gynecological exam, and the results were normal. She has received the hepatitis B vaccine series and gets her flu shot at work. She has had COVID-19 twice and received the initial series of COVID-19 vaccines and one booster.  She had her wisdom teeth removed on July 18th due to decay in one tooth, opting to have all removed at once.  No chest pain, shortness of breath, swelling in feet or ankles, or diarrhea. Reports issues with constipation and currently does not have her menstrual cycle.   Past Medical History:  Diagnosis Date   Allergy 2012   Anemia     Blood transfusion without reported diagnosis 2014   Very heavy menstrual cycle   Constipation, chronic    Hyperthyroidism      Family History  Problem Relation Age of Onset   Diabetes Mother    Hyperlipidemia Mother    Hypertension Maternal Grandmother    Glaucoma Maternal Grandmother    Heart disease Maternal Grandfather        Valve relacement, Bypass    Diabetes Paternal Grandmother    Cancer Maternal Great-grandmother    Colon cancer Maternal Great-grandmother    Breast cancer Neg Hx    Ovarian cancer Neg Hx    Endometrial cancer Neg Hx    Pancreatic cancer Neg Hx    Cancer - Prostate Neg Hx      Current Outpatient Medications:    cholecalciferol (VITAMIN D3) 25 MCG (1000 UNIT) tablet, Take 1,000 Units by mouth daily., Disp: , Rfl:    cyclobenzaprine  (FLEXERIL ) 10 MG tablet, Take 1 tablet (10 mg total) by mouth every 8 (eight) hours as needed., Disp: 30 tablet, Rfl: 1   ibuprofen  (ADVIL ) 800 MG tablet, Take 1 tablet (800 mg total) by mouth 3 (three) times daily., Disp: 30 tablet, Rfl: 2   meloxicam  (MOBIC ) 7.5 MG tablet, Take 1 tablet (7.5 mg total) by mouth daily., Disp: 30 tablet, Rfl: 2   Allergies  Allergen Reactions   Sulfa Antibiotics Anaphylaxis     Review of Systems  Constitutional: Negative.   HENT: Negative.    Eyes: Negative.  Respiratory: Negative.    Cardiovascular: Negative.   Gastrointestinal: Negative.   Endocrine: Negative.   Genitourinary: Negative.   Musculoskeletal: Negative.   Skin: Negative.   Allergic/Immunologic: Negative.   Neurological: Negative.   Hematological: Negative.   Psychiatric/Behavioral: Negative.       Today's Vitals   08/20/24 1114  BP: 108/64  Pulse: 72  Temp: 98.5 F (36.9 C)  TempSrc: Oral  Weight: 130 lb (59 kg)  Height: 5' 2 (1.575 m)  PainSc: 0-No pain   Body mass index is 23.78 kg/m.  Wt Readings from Last 3 Encounters:  08/20/24 130 lb (59 kg)  12/31/23 133 lb 12.8 oz (60.7 kg)  08/08/23 129  lb 12.8 oz (58.9 kg)      Objective:  Physical Exam Vitals and nursing note reviewed.  Constitutional:      General: She is not in acute distress.    Appearance: Normal appearance. She is well-developed and normal weight.  HENT:     Head: Normocephalic and atraumatic.     Right Ear: Hearing, tympanic membrane, ear canal and external ear normal. There is no impacted cerumen.     Left Ear: Hearing, tympanic membrane, ear canal and external ear normal. There is no impacted cerumen.     Nose: Nose normal.     Mouth/Throat:     Mouth: Mucous membranes are moist.  Eyes:     General: Lids are normal.     Extraocular Movements: Extraocular movements intact.     Conjunctiva/sclera: Conjunctivae normal.     Pupils: Pupils are equal, round, and reactive to light.     Funduscopic exam:    Right eye: No papilledema.        Left eye: No papilledema.  Neck:     Thyroid : No thyroid  mass.     Vascular: No carotid bruit.  Cardiovascular:     Rate and Rhythm: Normal rate and regular rhythm.     Pulses: Normal pulses.     Heart sounds: Normal heart sounds. No murmur heard. Pulmonary:     Effort: Pulmonary effort is normal. No respiratory distress.     Breath sounds: Normal breath sounds. No wheezing.  Chest:     Chest wall: No mass.  Breasts:    Tanner Score is 5.     Right: Normal. No mass or tenderness.     Left: Normal. No mass or tenderness.  Abdominal:     General: Abdomen is flat. Bowel sounds are normal. There is no distension.     Palpations: Abdomen is soft.     Tenderness: There is no abdominal tenderness.  Genitourinary:    Comments: Deferred - followed by GYN - Dr. Rutherford Musculoskeletal:        General: No swelling. Normal range of motion.     Cervical back: Full passive range of motion without pain, normal range of motion and neck supple.     Right lower leg: No edema.     Left lower leg: No edema.  Lymphadenopathy:     Upper Body:     Right upper body: No  supraclavicular, axillary or pectoral adenopathy.     Left upper body: No supraclavicular, axillary or pectoral adenopathy.  Skin:    General: Skin is warm and dry.     Capillary Refill: Capillary refill takes less than 2 seconds.  Neurological:     General: No focal deficit present.     Mental Status: She is alert and oriented to person, place, and  time.     Cranial Nerves: No cranial nerve deficit.     Sensory: No sensory deficit.     Motor: No weakness.  Psychiatric:        Mood and Affect: Mood normal.        Behavior: Behavior normal.        Thought Content: Thought content normal.        Judgment: Judgment normal.     Assessment And Plan:  Encounter for annual health examination Assessment & Plan: Routine wellness visit with normal vitals and recent normal mammogram. Cholesterol levels normal. Hepatitis B immunity status to be confirmed. Flu vaccination pending. No regular exercise reported. - Obtain copy of mammogram results from [facility] for records. - Perform hepatitis B immunity screening. - Encourage at least 30 minutes of daily exercise for cardiovascular health. - Discuss flu vaccination timing and encourage receiving it by the end of October.  Orders: -     CMP14+EGFR -     CBC with Differential/Platelet  Vitamin D  deficiency Assessment & Plan: Will check vitamin D  level and supplement as needed.    Also encouraged to spend 15 minutes in the sun daily.    Orders: -     VITAMIN D  25 Hydroxy (Vit-D Deficiency, Fractures)  Abnormal glucose Assessment & Plan: HgbA1c is stable, diet controlled. Will check HgbA1c today. Continue healthy diet low in sugar and starches.   Orders: -     Hemoglobin A1c  Elevated cholesterol Assessment & Plan: Cholesterol levels were slightly increased. Continue focusing on low fat diet. Will check lipid panel today  Orders: -     Lipid panel  Chronic idiopathic constipation Assessment & Plan: Chronic constipation likely due to  insufficient water and dietary fiber intake. - Recommend daily probiotic to help regulate bowel movements. - Advise increasing water intake and dietary fiber.   COVID-19 vaccination declined  Encounter for screening -     Hepatitis B surface antibody,qualitative  Neck pain Assessment & Plan: Left neck and shoulder pain with intermittent numbness of left thumb Intermittent pain and numbness possibly due to pinched nerve or bulging disc. Symptoms not currently present. MRI considered if symptoms persist, worsen, or increase in frequency. - Advise use of meloxicam  or ibuprofen  during symptomatic periods. - Recommend neck exercises to reduce nerve root inflammation. - Consider MRI if symptoms become persistent, worsen, or occur more frequently.     Return for 1 year physical, 6 month chol check.  Patient was given opportunity to ask questions. Patient verbalized understanding of the plan and was able to repeat key elements of the plan. All questions were answered to their satisfaction.    LILLETTE Debra Ada, FNP, have reviewed all documentation for this visit. The documentation on 08/20/24 for the exam, diagnosis, procedures, and orders are all accurate and complete.   IF YOU HAVE BEEN REFERRED TO A SPECIALIST, IT MAY TAKE 1-2 WEEKS TO SCHEDULE/PROCESS THE REFERRAL. IF YOU HAVE NOT HEARD FROM US /SPECIALIST IN TWO WEEKS, PLEASE GIVE US  A CALL AT [phone].

## 2024-08-21 ENCOUNTER — Encounter: Payer: Self-pay | Admitting: Nurse Practitioner

## 2024-08-21 LAB — CMP14+EGFR
ALT: 9 IU/L (ref 0–32)
AST: 13 IU/L (ref 0–40)
Albumin: 4.9 g/dL (ref 3.8–4.9)
Alkaline Phosphatase: 113 IU/L (ref 44–121)
BUN/Creatinine Ratio: 15 (ref 9–23)
BUN: 12 mg/dL (ref 6–24)
Bilirubin Total: 0.7 mg/dL (ref 0.0–1.2)
CO2: 23 mmol/L (ref 20–29)
Calcium: 9.9 mg/dL (ref 8.7–10.2)
Chloride: 103 mmol/L (ref 96–106)
Creatinine, Ser: 0.81 mg/dL (ref 0.57–1.00)
Globulin, Total: 2.5 g/dL (ref 1.5–4.5)
Glucose: 87 mg/dL (ref 70–99)
Potassium: 4.8 mmol/L (ref 3.5–5.2)
Sodium: 141 mmol/L (ref 134–144)
Total Protein: 7.4 g/dL (ref 6.0–8.5)
eGFR: 87 mL/min/1.73 (ref >59)

## 2024-08-21 LAB — CBC WITH DIFFERENTIAL/PLATELET
Basophils Absolute: 0 x10E3/uL (ref 0.0–0.2)
Basos: 1 %
EOS (ABSOLUTE): 0.1 x10E3/uL (ref 0.0–0.4)
Eos: 3 %
Hematocrit: 42.3 % (ref 34.0–46.6)
Hemoglobin: 13.8 g/dL (ref 11.1–15.9)
Immature Grans (Abs): 0 x10E3/uL (ref 0.0–0.1)
Immature Granulocytes: 0 %
Lymphocytes Absolute: 2 x10E3/uL (ref 0.7–3.1)
Lymphs: 52 %
MCH: 31.4 pg (ref 26.6–33.0)
MCHC: 32.6 g/dL (ref 31.5–35.7)
MCV: 96 fL (ref 79–97)
Monocytes Absolute: 0.5 x10E3/uL (ref 0.1–0.9)
Monocytes: 13 %
Neutrophils Absolute: 1.1 x10E3/uL — ABNORMAL LOW (ref 1.4–7.0)
Neutrophils: 31 %
Platelets: 303 x10E3/uL (ref 150–450)
RBC: 4.39 x10E6/uL (ref 3.77–5.28)
RDW: 12 % (ref 11.7–15.4)
WBC: 3.7 x10E3/uL (ref 3.4–10.8)

## 2024-08-21 LAB — LIPID PANEL
Chol/HDL Ratio: 2 ratio (ref 0.0–4.4)
Cholesterol, Total: 209 mg/dL — ABNORMAL HIGH (ref 100–199)
HDL: 104 mg/dL (ref >39)
LDL Chol Calc (NIH): 97 mg/dL (ref 0–99)
Triglycerides: 45 mg/dL (ref 0–149)
VLDL Cholesterol Cal: 8 mg/dL (ref 5–40)

## 2024-08-21 LAB — HEMOGLOBIN A1C
Est. average glucose Bld gHb Est-mCnc: 111 mg/dL
Hgb A1c MFr Bld: 5.5 % (ref 4.8–5.6)

## 2024-08-21 LAB — HEPATITIS B SURFACE ANTIBODY,QUALITATIVE: Hep B Surface Ab, Qual: REACTIVE

## 2024-08-21 LAB — VITAMIN D 25 HYDROXY (VIT D DEFICIENCY, FRACTURES): Vit D, 25-Hydroxy: 24 ng/mL — ABNORMAL LOW (ref 30.0–100.0)

## 2024-08-31 ENCOUNTER — Ambulatory Visit: Payer: Self-pay | Admitting: Nurse Practitioner

## 2024-08-31 NOTE — Assessment & Plan Note (Signed)
 Will check vitamin D  level and supplement as needed.    Also encouraged to spend 15 minutes in the sun daily.

## 2024-08-31 NOTE — Assessment & Plan Note (Signed)
 Chronic constipation likely due to insufficient water and dietary fiber intake. - Recommend daily probiotic to help regulate bowel movements. - Advise increasing water intake and dietary fiber.

## 2024-08-31 NOTE — Assessment & Plan Note (Signed)
HgbA1c is stable, diet controlled. Will check HgbA1c today. Continue healthy diet low in sugar and starches.

## 2024-08-31 NOTE — Assessment & Plan Note (Signed)
Cholesterol levels were slightly increased. Continue focusing on low fat diet. Will check lipid panel today

## 2024-08-31 NOTE — Assessment & Plan Note (Signed)
 Left neck and shoulder pain with intermittent numbness of left thumb Intermittent pain and numbness possibly due to pinched nerve or bulging disc. Symptoms not currently present. MRI considered if symptoms persist, worsen, or increase in frequency. - Advise use of meloxicam  or ibuprofen  during symptomatic periods. - Recommend neck exercises to reduce nerve root inflammation. - Consider MRI if symptoms become persistent, worsen, or occur more frequently.

## 2024-08-31 NOTE — Assessment & Plan Note (Signed)
 Routine wellness visit with normal vitals and recent normal mammogram. Cholesterol levels normal. Hepatitis B immunity status to be confirmed. Flu vaccination pending. No regular exercise reported. - Obtain copy of mammogram results from [facility] for records. - Perform hepatitis B immunity screening. - Encourage at least 30 minutes of daily exercise for cardiovascular health. - Discuss flu vaccination timing and encourage receiving it by the end of October.

## 2024-09-01 ENCOUNTER — Encounter: Payer: Self-pay | Admitting: Nurse Practitioner

## 2024-09-03 ENCOUNTER — Other Ambulatory Visit: Payer: Self-pay

## 2024-09-03 ENCOUNTER — Other Ambulatory Visit: Payer: Self-pay | Admitting: Nurse Practitioner

## 2024-09-03 DIAGNOSIS — D709 Neutropenia, unspecified: Secondary | ICD-10-CM | POA: Diagnosis not present

## 2024-09-04 LAB — HIV ANTIBODY (ROUTINE TESTING W REFLEX): HIV Screen 4th Generation wRfx: NONREACTIVE

## 2024-09-04 LAB — IRON,TIBC AND FERRITIN PANEL
Ferritin: 85 ng/mL (ref 15–150)
Iron Saturation: 30 % (ref 15–55)
Iron: 99 ug/dL (ref 27–159)
Total Iron Binding Capacity: 332 ug/dL (ref 250–450)
UIBC: 233 ug/dL (ref 131–425)

## 2024-09-04 LAB — AUTOIMMUNE PROFILE
Anti Nuclear Antibody (ANA): NEGATIVE
Complement C3, Serum: 126 mg/dL (ref 82–167)
dsDNA Ab: <1 [IU]/mL (ref 0–9)

## 2024-09-04 LAB — VITAMIN B12: Vitamin B-12: 406 pg/mL (ref 232–1245)

## 2024-09-13 ENCOUNTER — Encounter: Payer: Self-pay | Admitting: Nurse Practitioner

## 2024-09-16 ENCOUNTER — Other Ambulatory Visit: Payer: Self-pay | Admitting: Nurse Practitioner

## 2024-09-16 DIAGNOSIS — D709 Neutropenia, unspecified: Secondary | ICD-10-CM

## 2024-09-30 ENCOUNTER — Encounter: Payer: Self-pay | Admitting: Nurse Practitioner

## 2024-10-07 ENCOUNTER — Encounter: Payer: Self-pay | Admitting: Nurse Practitioner

## 2024-10-07 ENCOUNTER — Other Ambulatory Visit (HOSPITAL_COMMUNITY): Payer: Self-pay

## 2024-10-07 ENCOUNTER — Ambulatory Visit: Payer: Self-pay | Admitting: Nurse Practitioner

## 2024-10-07 ENCOUNTER — Ambulatory Visit: Admitting: Nurse Practitioner

## 2024-10-07 VITALS — BP 110/80 | HR 71 | Temp 98.1°F | Ht 62.0 in | Wt 130.0 lb

## 2024-10-07 DIAGNOSIS — J40 Bronchitis, not specified as acute or chronic: Secondary | ICD-10-CM | POA: Diagnosis not present

## 2024-10-07 DIAGNOSIS — D709 Neutropenia, unspecified: Secondary | ICD-10-CM | POA: Diagnosis not present

## 2024-10-07 DIAGNOSIS — Z8616 Personal history of COVID-19: Secondary | ICD-10-CM

## 2024-10-07 MED ORDER — AIRSUPRA 90-80 MCG/ACT IN AERO
2.0000 | INHALATION_SPRAY | Freq: Four times a day (QID) | RESPIRATORY_TRACT | 0 refills | Status: AC | PRN
  Filled 2024-10-07: qty 32.1, 90d supply, fill #0

## 2024-10-07 MED ORDER — PREDNISONE 10 MG (21) PO TBPK
ORAL_TABLET | ORAL | 0 refills | Status: AC
  Filled 2024-10-07: qty 21, 6d supply, fill #0

## 2024-10-07 NOTE — Progress Notes (Signed)
 I,Victoria T Emmitt, CMA,acting as a neurosurgeon for Debra Ada, FNP.,have documented all relevant documentation on the behalf of Debra Ada, FNP,as directed by  Debra Ada, FNP while in the presence of Debra Ada, FNP.  Subjective:  Patient ID: Debra Armstrong , female    DOB: Aug 31, 1971 , 53 y.o.   MRN: [id number]  Chief Complaint  Patient presents with   Cough    Patient presents today for cough associated with right side chest discomfort when coughing. This initially started last month. This started after having Covid. She admits still having a lingering cough. She requests wanting to complete Chest Xray.  Denies headache, dizziness.      HPI  Discussed the use of AI scribe software for clinical note transcription with the patient, who gave verbal consent to proceed.  History of Present Illness Debra Armstrong is a 53 year old female who presents with persistent cough and chest pain following a COVID-19 infection.  She has been experiencing a persistent dry cough and chest pain since contracting COVID-19 at the end of September. The chest pain is constant and worsens with coughing, laughing, or physical exertion, such as changing a smoke detector. The pain is located on the side of her chest, sometimes radiating underneath her breast and down the middle of her chest.  She initially tested for COVID-19 at home using a test from [facility]. During the infection, she had a fever of 102F but has not had a fever since. She returned to work shortly after testing positive, wearing a mask, and only took one day off work. She was exposed to COVID-19 through a patient interaction without wearing a mask at her workplace.  No shortness of breath, but she feels slightly winded at times and needs to take deep breaths. She has not used an inhaler before and this is her first COVID-19 infection. She is concerned about her low neutrophil count from previous lab results. She is awaiting a  hematology appointment on November 7th after an initial appointment was rescheduled from October 31st.  She has been using over-the-counter cough syrup to manage her symptoms but finds it insufficient for the persistent cough.   Past Medical History:  Diagnosis Date   Allergy 2012   Anemia    Blood transfusion without reported diagnosis 2014   Very heavy menstrual cycle   Constipation, chronic    Hyperthyroidism      Family History  Problem Relation Age of Onset   Diabetes Mother    Hyperlipidemia Mother    Hypertension Maternal Grandmother    Glaucoma Maternal Grandmother    Heart disease Maternal Grandfather        Valve relacement, Bypass    Diabetes Paternal Grandmother    Cancer Maternal Great-grandmother    Colon cancer Maternal Great-grandmother    Breast cancer Neg Hx    Ovarian cancer Neg Hx    Endometrial cancer Neg Hx    Pancreatic cancer Neg Hx    Cancer - Prostate Neg Hx      Current Outpatient Medications:    Albuterol-Budesonide (AIRSUPRA) 90-80 MCG/ACT AERO, Inhale 2 puffs into the lungs every 6 (six) hours as needed., Disp: 32.1 g, Rfl: 0   cholecalciferol (VITAMIN D3) 25 MCG (1000 UNIT) tablet, Take 1,000 Units by mouth daily., Disp: , Rfl:    cyclobenzaprine  (FLEXERIL ) 10 MG tablet, Take 1 tablet (10 mg total) by mouth every 8 (eight) hours as needed., Disp: 30 tablet, Rfl: 1   ibuprofen  (ADVIL )  800 MG tablet, Take 1 tablet (800 mg total) by mouth 3 (three) times daily., Disp: 30 tablet, Rfl: 2   meloxicam  (MOBIC ) 7.5 MG tablet, Take 1 tablet (7.5 mg total) by mouth daily., Disp: 30 tablet, Rfl: 2   Allergies  Allergen Reactions   Sulfa Antibiotics Anaphylaxis     Review of Systems  Constitutional: Negative.   Respiratory:  Positive for cough. Negative for wheezing.   Cardiovascular: Negative.   Neurological: Negative.   Psychiatric/Behavioral: Negative.       Today's Vitals   10/07/24 1618  BP: 110/80  Pulse: 71  Temp: 98.1 F (36.7 C)   SpO2: 98%  Weight: 130 lb (59 kg)  Height: 5' 2 (1.575 m)   Body mass index is 23.78 kg/m.  Wt Readings from Last 3 Encounters:  10/07/24 130 lb (59 kg)  08/20/24 130 lb (59 kg)  12/31/23 133 lb 12.8 oz (60.7 kg)     Objective:  Physical Exam Vitals and nursing note reviewed.  Constitutional:      General: She is not in acute distress.    Appearance: Normal appearance.  Cardiovascular:     Rate and Rhythm: Normal rate and regular rhythm.     Pulses: Normal pulses.     Heart sounds: Normal heart sounds. No murmur heard. Pulmonary:     Effort: Pulmonary effort is normal. No respiratory distress.     Breath sounds: Normal breath sounds. No wheezing.  Neurological:     Mental Status: She is alert.      Assessment And Plan:  Bronchitis Assessment & Plan: Persistent dry cough and chest pain post-COVID-19, likely bronchitis with inflammation. No fever or dyspnea. Lungs clear. Differential includes bronchitis. - Order chest x-ray to rule out other causes of chest pain. - Prescribe prednisone, starting with 6 tablets on the first day, then decrease by one tablet daily. - Provide coupon for albuterol inhaler with steroid Neurosurgeon) for persistent cough. - Advise starting prednisone in the morning to avoid insomnia. - Instruct to wear a mask at work while taking steroids to prevent infections.  Orders: -     Airsupra; Inhale 2 puffs into the lungs every 6 (six) hours as needed.  Dispense: 32.1 g; Refill: 0 -     DG Chest 2 View; Future -     predniSONE; Take 6 tablets (60 mg total) by mouth daily for 1 day, THEN 5 tablets (50 mg total) daily for 1 day, THEN 4 tablets (40 mg total) daily for 1 day, THEN 3 tablets (30 mg total) daily for 1 day, THEN 2 tablets (20 mg total) daily for 1 day, THEN 1 tablet (10 mg total) daily for 1 day.  Dispense: 21 tablet; Refill: 0  Neutropenia, unspecified type Assessment & Plan: Low neutrophil count, increasing risk of infection. Hematology  appointment scheduled. No new symptoms of infection or complications. - Attend hematology appointment on November 7th.   History of COVID-19 -     Airsupra; Inhale 2 puffs into the lungs every 6 (six) hours as needed.  Dispense: 32.1 g; Refill: 0 -     DG Chest 2 View; Future -     predniSONE; Take 6 tablets (60 mg total) by mouth daily for 1 day, THEN 5 tablets (50 mg total) daily for 1 day, THEN 4 tablets (40 mg total) daily for 1 day, THEN 3 tablets (30 mg total) daily for 1 day, THEN 2 tablets (20 mg total) daily for 1 day, THEN 1  tablet (10 mg total) daily for 1 day.  Dispense: 21 tablet; Refill: 0    No follow-ups on file.  Patient was given opportunity to ask questions. Patient verbalized understanding of the plan and was able to repeat key elements of the plan. All questions were answered to their satisfaction.  Debra Ada, FNP  I, Debra Ada, FNP, have reviewed all documentation for this visit. The documentation on 10/07/24 for the exam, diagnosis, procedures, and orders are all accurate and complete.   IF YOU HAVE BEEN REFERRED TO A SPECIALIST, IT MAY TAKE 1-2 WEEKS TO SCHEDULE/PROCESS THE REFERRAL. IF YOU HAVE NOT HEARD FROM US /SPECIALIST IN TWO WEEKS, PLEASE GIVE US  A CALL AT [phone].   THE PATIENT IS ENCOURAGED TO PRACTICE SOCIAL DISTANCING DUE TO THE COVID-19 PANDEMIC.

## 2024-10-07 NOTE — Patient Instructions (Signed)
 Chest Wall Pain Chest wall pain is pain in or around the bones and muscles of your chest. Sometimes, an injury causes this pain. Excessive coughing or overuse of arm and chest muscles may also cause chest wall pain. Sometimes, the cause may not be known. This pain may take several weeks or longer to get better. Follow these instructions at home: Managing pain, stiffness, and swelling  If directed, put ice on the painful area: Put ice in a plastic bag. Place a towel between your skin and the bag. Leave the ice on for 20 minutes, 2-3 times per day. Activity Rest as told by your health care provider. Avoid activities that cause pain. These include any activities that use your chest muscles or your abdominal and side muscles to lift heavy items. Ask your health care provider what activities are safe for you. General instructions  Take over-the-counter and prescription medicines only as told by your health care provider. Do not use any products that contain nicotine or tobacco, such as cigarettes, e-cigarettes, and chewing tobacco. These can delay healing after injury. If you need help quitting, ask your health care provider. Keep all follow-up visits as told by your health care provider. This is important. Contact a health care provider if: You have a fever. Your chest pain becomes worse. You have new symptoms. Get help right away if: You have nausea or vomiting. You feel sweaty or light-headed. You have a cough with mucus from your lungs (sputum) or you cough up blood. You develop shortness of breath. These symptoms may represent a serious problem that is an emergency. Do not wait to see if the symptoms will go away. Get medical help right away. Call your local emergency services (911 in the [place] Do not drive yourself to the hospital. Summary Chest wall pain is pain in or around the bones and muscles of your chest. Depending on the cause, it may be treated with ice, rest, medicines, and  avoiding activities that cause pain. Contact a health care provider if you have a fever, worsening chest pain, or new symptoms. Get help right away if you feel light-headed or you develop shortness of breath. These symptoms may be an emergency. This information is not intended to replace advice given to you by your health care provider. Make sure you discuss any questions you have with your health care provider. Document Revised: 11/27/2022 Document Reviewed: 11/27/2022 Elsevier Patient Education  2024 [employer].

## 2024-10-08 ENCOUNTER — Ambulatory Visit (HOSPITAL_COMMUNITY)
Admission: RE | Admit: 2024-10-08 | Discharge: 2024-10-08 | Disposition: A | Discharge status: Home / Self Care | Source: Ambulatory Visit | Attending: Nurse Practitioner | Admitting: Nurse Practitioner

## 2024-10-08 ENCOUNTER — Other Ambulatory Visit: Payer: Self-pay

## 2024-10-08 DIAGNOSIS — R053 Chronic cough: Secondary | ICD-10-CM | POA: Diagnosis not present

## 2024-10-08 DIAGNOSIS — J40 Bronchitis, not specified as acute or chronic: Secondary | ICD-10-CM | POA: Diagnosis not present

## 2024-10-08 DIAGNOSIS — Z8616 Personal history of COVID-19: Secondary | ICD-10-CM | POA: Diagnosis not present

## 2024-10-09 ENCOUNTER — Ambulatory Visit: Payer: Self-pay | Admitting: Nurse Practitioner

## 2024-10-15 DIAGNOSIS — D709 Neutropenia, unspecified: Secondary | ICD-10-CM | POA: Insufficient documentation

## 2024-10-15 DIAGNOSIS — J40 Bronchitis, not specified as acute or chronic: Secondary | ICD-10-CM | POA: Insufficient documentation

## 2024-10-15 NOTE — Assessment & Plan Note (Signed)
 Low neutrophil count, increasing risk of infection. Hematology appointment scheduled. No new symptoms of infection or complications. - Attend hematology appointment on November 7th.

## 2024-10-15 NOTE — Assessment & Plan Note (Signed)
 Persistent dry cough and chest pain post-COVID-19, likely bronchitis with inflammation. No fever or dyspnea. Lungs clear. Differential includes bronchitis. - Order chest x-ray to rule out other causes of chest pain. - Prescribe prednisone, starting with 6 tablets on the first day, then decrease by one tablet daily. - Provide coupon for albuterol inhaler with steroid Neurosurgeon) for persistent cough. - Advise starting prednisone in the morning to avoid insomnia. - Instruct to wear a mask at work while taking steroids to prevent infections.

## 2024-10-17 ENCOUNTER — Other Ambulatory Visit

## 2024-10-17 ENCOUNTER — Encounter: Admitting: Physician Assistant

## 2024-10-20 ENCOUNTER — Other Ambulatory Visit (HOSPITAL_COMMUNITY): Payer: Self-pay

## 2024-10-23 ENCOUNTER — Other Ambulatory Visit: Payer: Self-pay | Admitting: Physician Assistant

## 2024-10-23 DIAGNOSIS — H5203 Hypermetropia, bilateral: Secondary | ICD-10-CM | POA: Diagnosis not present

## 2024-10-23 DIAGNOSIS — D709 Neutropenia, unspecified: Secondary | ICD-10-CM

## 2024-10-23 NOTE — Progress Notes (Unsigned)
 [facility] [phone]   [phone]  CONSULT NOTE  REFERRING PHYSICIAN: [name] NP  REASON FOR CONSULTATION:  Neutropenia   HPI Debra Armstrong is a 53 y.o. female past medical history for HPV, vitamin D , and history of hyperthyroidism. She was referred to the clinic for for neutropenia.  The patient had a follow-up visit with her PCP on 10/07/2024.  The patient was seen for a COVID-19 follow-up (had COVID-19 at the end of September) and was having some chest discomfort and persistent cough at that time. She had a CXR and was given prednisone. Her symptoms improved.   She had labs drawn on 08/20/24 which showed low total WBC of 3.7 and ANC of 1.1 In retrospect, her white blood cell count was also low in August 2024 in which her WBC was 3.6 and ANC of 1.3 at that time.   The patient did have some blood work performed including B12, iron, ferritin, HIV, autoimmune profile, and hepatitis B surface antibody testing. These were unremarkable.   Due to this, she was referred to the clinic for further evaluation.   Regarding the leukopenia, this is the first time she was told she had a low white blood cell count was in 2024. She recalls when she was little she may have had leukopenia but it resolved.  She rarely gets sick. I have some labs available from 2014-2024 in which her [facility] was over the reference range.   She does not take any regular medications.   She reportedly a history of hyperthyroidism and was on methimazole in the past but her symptoms resolved and methimazole was discontinued. It was estimated this may have been ~2021.   She denies any personal history of inflammatory disorders. She denies any unusual bloating or early satiety.  She does not have any dietary restrictions. She denies fever, chills, or unexplained weight loss. Denies lymphadenopathy. Denies any history of HIV or hepatitis. Denies any known tick borne illness. She was  vaccinated for hepatitis B.   She denies rashes. She did have left nek pain that radiated down the left arm/thumb in January 2025 but it resolved.   Her family history consists of a mother who had diabetes.  The patient's grandmother had hypertension, glaucoma, and kidney disease.  She denies any known family history of blood or bone marrow disorders.  She works as a engineer, civil (consulting).  She is single.  She has 1 child.  She denies any drug, smoking, or alcohol use.   HPI  Past Medical History:  Diagnosis Date   Allergy 2012   Anemia    Blood transfusion without reported diagnosis 2014   Very heavy menstrual cycle   Constipation, chronic    Hyperthyroidism     Past Surgical History:  Procedure Laterality Date   CESAREAN SECTION  1996   Breech   CHOLECYSTECTOMY  2010   EYE SURGERY Bilateral 2019   Glaucoma, narrow angle     Family History  Problem Relation Age of Onset   Diabetes Mother    Hyperlipidemia Mother    Hypertension Maternal Grandmother    Glaucoma Maternal Grandmother    Heart disease Maternal Grandfather        Valve relacement, Bypass    Diabetes Paternal Grandmother    Cancer Maternal Great-grandmother    Colon cancer Maternal Great-grandmother    Breast cancer Neg Hx    Ovarian cancer Neg Hx    Endometrial cancer Neg Hx  Pancreatic cancer Neg Hx    Cancer - Prostate Neg Hx     Social History Social History   Tobacco Use   Smoking status: Never   Smokeless tobacco: Never  Vaping Use   Vaping status: Never Used  Substance Use Topics   Alcohol use: Never   Drug use: Never    Allergies  Allergen Reactions   Sulfa Antibiotics Anaphylaxis    Current Outpatient Medications  Medication Sig Dispense Refill   Albuterol-Budesonide (AIRSUPRA) 90-80 MCG/ACT AERO Inhale 2 puffs into the lungs every 6 (six) hours as needed. 32.1 g 0   cholecalciferol (VITAMIN D3) 25 MCG (1000 UNIT) tablet Take 1,000 Units by mouth daily.     cyclobenzaprine  (FLEXERIL ) 10  MG tablet Take 1 tablet (10 mg total) by mouth every 8 (eight) hours as needed. 30 tablet 1   ibuprofen  (ADVIL ) 800 MG tablet Take 1 tablet (800 mg total) by mouth 3 (three) times daily. 30 tablet 2   meloxicam  (MOBIC ) 7.5 MG tablet Take 1 tablet (7.5 mg total) by mouth daily. 30 tablet 2   No current facility-administered medications for this visit.    REVIEW OF SYSTEMS:   Review of Systems  Constitutional: Negative for appetite change, chills, fatigue, fever and unexpected weight change.  HENT: Negative for mouth sores, nosebleeds, sore throat and trouble swallowing.   Eyes: Negative for eye problems and icterus.  Respiratory: Negative for cough, hemoptysis, shortness of breath and wheezing.   Cardiovascular: Negative for chest pain and leg swelling.  Gastrointestinal: Negative for abdominal pain, constipation, diarrhea, nausea and vomiting.  Genitourinary: Negative for bladder incontinence, difficulty urinating, dysuria, frequency and hematuria.   Musculoskeletal: Negative for back pain, gait problem, neck pain and neck stiffness.  Skin: Negative for itching and rash.  Neurological: Negative for dizziness, extremity weakness, gait problem, headaches, light-headedness and seizures.  Hematological: Negative for adenopathy. Does not bruise/bleed easily.  Psychiatric/Behavioral: Negative for confusion, depression and sleep disturbance. The patient is not nervous/anxious.     PHYSICAL EXAMINATION:  Blood pressure 110/74, pulse 84, temperature 98.5 F (36.9 C), temperature source Temporal, resp. rate 16, height 5' 2 (1.575 m), weight 135 lb (61.2 kg), last menstrual period 10/12/2019, SpO2 100%.  ECOG PERFORMANCE STATUS: 0  Physical Exam  Constitutional: Oriented to person, place, and time and well-developed, well-nourished, and in no distress.  HENT:  Head: Normocephalic and atraumatic.  Mouth/Throat: Oropharynx is clear and moist. No oropharyngeal exudate.  Eyes: Conjunctivae are  normal. Right eye exhibits no discharge. Left eye exhibits no discharge. No scleral icterus.  Neck: Normal range of motion. Neck supple.  Cardiovascular: Normal rate, regular rhythm, normal heart sounds and intact distal pulses.   Pulmonary/Chest: Effort normal and breath sounds normal. No respiratory distress. No wheezes. No rales.  Abdominal: Soft. Bowel sounds are normal. Exhibits no distension and no mass. There is no tenderness.  Musculoskeletal: Normal range of motion. Exhibits no edema.  Lymphadenopathy:    No cervical adenopathy.  Neurological: Alert and oriented to person, place, and time. Exhibits normal muscle tone. Gait normal. Coordination normal.  Skin: Skin is warm and dry. No rash noted. Not diaphoretic. No erythema. No pallor.  Psychiatric: Mood, memory and judgment normal.  Vitals reviewed.  LABORATORY DATA: Lab Results  Component Value Date   WBC 3.8 (L) 10/24/2024   HGB 13.7 10/24/2024   HCT 40.0 10/24/2024   MCV 90.5 10/24/2024   PLT 295 10/24/2024      Chemistry  Component Value Date/Time   NA 141 10/24/2024 1118   NA 141 08/20/2024 1205   K 4.0 10/24/2024 1118   CL 105 10/24/2024 1118   CO2 30 10/24/2024 1118   BUN 10 10/24/2024 1118   BUN 12 08/20/2024 1205   CREATININE 0.84 10/24/2024 1118   CREATININE 0.85 07/19/2020 1114      Component Value Date/Time   CALCIUM 9.6 10/24/2024 1118   ALKPHOS 99 10/24/2024 1118   AST 13 (L) 10/24/2024 1118   ALT 9 10/24/2024 1118   BILITOT 0.8 10/24/2024 1118       RADIOGRAPHIC STUDIES: DG Chest 2 View Result Date: 10/08/2024 CLINICAL DATA:  persistent cough EXAM: CHEST - 2 VIEW COMPARISON:  None available. FINDINGS: No focal airspace consolidation, pleural effusion, or pneumothorax. No cardiomegaly.No acute fracture or destructive lesion. IMPRESSION: No acute cardiopulmonary abnormality. Electronically Signed   By: Rogelia Myers M.D.   On: 10/08/2024 19:48    ASSESSMENT: This very pleasant  53 year old African-American female referred to the clinic for leukopenia and iron deficiency anemia.   The patient was seen with Dr. Federico today.    After review of the labs, review of the records, and discussion with the patient the patients findings are most consistent with leukopenia.   The differential for neutropenia includes nutritional deficiency, inflammatory disorder, medication side effect, infectious etiology, or congenital condition (such as benign ethnic neutropenia). The patient is not taking any medications known to cause neutropenia. Workup for this condition includes viral serologies (HIV, Hep B and Hep C), vitamin b12/folate, and inflammatory markers with ESR and CRP.       #Leukopenia  --The patient was seen today in consultation with Dr. Federico. --Work up with include: --repeat CBC and CMP --infectious serology testing with Hep B and Hep C --Nutritional assessment: Vitamin B12, methylmalonic acid, and folate levels --Inflammatory markers: ESR and CRP --Given history of hyperthyroidism, Dr. Federico recommends TSH/T4 --I will call her with the results early next week and recommendations.  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 Of course, if any concerning findings, we will contact her to be seen sooner   The patient voices understanding of current disease status and treatment options and is in agreement with the current care plan.  All questions were answered. The patient knows to call the clinic with any problems, questions or concerns. We can certainly see the patient much sooner if necessary.  Thank you so much for allowing me to participate in the care of Debra Armstrong. I will continue to follow up the patient with you and assist in her care.   Disclaimer: This note was dictated with voice recognition software. Similar sounding words can inadvertently be transcribed and may not be corrected  upon review.   Anamika Kueker L Quinlan Vollmer October 24, 2024, 1:13 PM  I have read the above note and personally examined the patient. I agree with the assessment and plan as noted above.  Briefly Debra Armstrong is a 53 year old female who presents for evaluation of mild leukopenia and neutropenia.  Review of her blood work shows that her white blood cell counts are always low to low normal with an ANC always greater than 1.0.  On 08/08/2023 patient had white blood cell count 3.6 with ANC of 1.3.  On 08/20/2024 patient had a white blood cell count of 3.7 with an ANC of 1.1.  Due to this concern the patient was  referred to hematology for further evaluation and management.  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  The patient is not prone to recurrent infections and has not had any recent pneumonias or sinus infections.  Additionally possible causes could include nutritional deficiency, viral illness, or liver disease.  Today we will order a full nutritional panel in addition to inflammatory markers and viral serologies.  The patient voiced understanding of our findings and recommendations.  If no concerning abnormalities are found would recommend the patient return to clinic in 6 months time to reevaluate.   Norleen IVAR Kidney, MD Department of Hematology/Oncology [facility] Phone: [phone] Pager: [phone] Email: [email or web address]

## 2024-10-24 ENCOUNTER — Inpatient Hospital Stay

## 2024-10-24 ENCOUNTER — Inpatient Hospital Stay: Attending: Physician Assistant | Admitting: Physician Assistant

## 2024-10-24 VITALS — BP 110/74 | HR 84 | Temp 98.5°F | Resp 16 | Ht 62.0 in | Wt 135.0 lb

## 2024-10-24 DIAGNOSIS — E0590 Thyrotoxicosis, unspecified without thyrotoxic crisis or storm: Secondary | ICD-10-CM | POA: Diagnosis not present

## 2024-10-24 DIAGNOSIS — Z800 Family history of malignant neoplasm of digestive organs: Secondary | ICD-10-CM | POA: Insufficient documentation

## 2024-10-24 DIAGNOSIS — D509 Iron deficiency anemia, unspecified: Secondary | ICD-10-CM | POA: Insufficient documentation

## 2024-10-24 DIAGNOSIS — D709 Neutropenia, unspecified: Secondary | ICD-10-CM | POA: Insufficient documentation

## 2024-10-24 LAB — CBC WITH DIFFERENTIAL (CANCER CENTER ONLY)
Abs Immature Granulocytes: 0.01 K/uL (ref 0.00–0.07)
Basophils Absolute: 0 K/uL (ref 0.0–0.1)
Basophils Relative: 1 %
Eosinophils Absolute: 0.1 K/uL (ref 0.0–0.5)
Eosinophils Relative: 2 %
HCT: 40 % (ref 36.0–46.0)
Hemoglobin: 13.7 g/dL (ref 12.0–15.0)
Immature Granulocytes: 0 %
Lymphocytes Relative: 39 %
Lymphs Abs: 1.5 K/uL (ref 0.7–4.0)
MCH: 31 pg (ref 26.0–34.0)
MCHC: 34.3 g/dL (ref 30.0–36.0)
MCV: 90.5 fL (ref 80.0–100.0)
Monocytes Absolute: 0.4 K/uL (ref 0.1–1.0)
Monocytes Relative: 11 %
Neutro Abs: 1.8 K/uL (ref 1.7–7.7)
Neutrophils Relative %: 47 %
Platelet Count: 295 K/uL (ref 150–400)
RBC: 4.42 MIL/uL (ref 3.87–5.11)
RDW: 12 % (ref 11.5–15.5)
Smear Review: NORMAL
WBC Count: 3.8 K/uL — ABNORMAL LOW (ref 4.0–10.5)
nRBC: 0 % (ref 0.0–0.2)

## 2024-10-24 LAB — CMP (CANCER CENTER ONLY)
ALT: 9 U/L (ref 0–44)
AST: 13 U/L — ABNORMAL LOW (ref 15–41)
Albumin: 4.7 g/dL (ref 3.5–5.0)
Alkaline Phosphatase: 99 U/L (ref 38–126)
Anion gap: 6 (ref 5–15)
BUN: 10 mg/dL (ref 6–20)
CO2: 30 mmol/L (ref 22–32)
Calcium: 9.6 mg/dL (ref 8.9–10.3)
Chloride: 105 mmol/L (ref 98–111)
Creatinine: 0.84 mg/dL (ref 0.44–1.00)
GFR, Estimated: >60 mL/min (ref >60)
Glucose, Bld: 88 mg/dL (ref 70–99)
Potassium: 4 mmol/L (ref 3.5–5.1)
Sodium: 141 mmol/L (ref 135–145)
Total Bilirubin: 0.8 mg/dL (ref 0.0–1.2)
Total Protein: 7.7 g/dL (ref 6.5–8.1)

## 2024-10-24 LAB — TSH: TSH: 0.78 u[IU]/mL (ref 0.350–4.500)

## 2024-10-24 LAB — SEDIMENTATION RATE: Sed Rate: 5 mm/h (ref 0–22)

## 2024-10-24 LAB — WBC/PLT IN CITRATE

## 2024-10-24 LAB — C-REACTIVE PROTEIN: CRP: <0.5 mg/dL (ref <1.0)

## 2024-10-24 LAB — HEPATITIS B SURFACE ANTIGEN: Hepatitis B Surface Ag: NONREACTIVE

## 2024-10-24 LAB — FOLATE: Folate: 8.5 ng/mL (ref >5.9)

## 2024-10-24 LAB — T4, FREE: Free T4: 0.74 ng/dL (ref 0.61–1.12)

## 2024-10-24 LAB — HEPATITIS C ANTIBODY: HCV Ab: NONREACTIVE

## 2024-10-25 LAB — HEPATITIS B CORE ANTIBODY, TOTAL: HEP B CORE AB: NEGATIVE

## 2024-10-28 ENCOUNTER — Telehealth: Payer: Self-pay | Admitting: Hematology and Oncology

## 2024-10-28 NOTE — Telephone Encounter (Signed)
 Scheduled patient for next appointment. Called and spoke with the patient, she is aware.

## 2024-10-31 ENCOUNTER — Encounter: Payer: Self-pay | Admitting: Physician Assistant

## 2024-11-01 LAB — METHYLMALONIC ACID, SERUM: Methylmalonic Acid, Quantitative: 91 nmol/L (ref 0–378)

## 2025-02-17 ENCOUNTER — Ambulatory Visit: Payer: Self-pay | Admitting: Nurse Practitioner

## 2025-04-29 ENCOUNTER — Inpatient Hospital Stay

## 2025-04-29 ENCOUNTER — Inpatient Hospital Stay: Admitting: Hematology and Oncology

## 2025-08-26 ENCOUNTER — Encounter: Payer: Self-pay | Admitting: Nurse Practitioner
